# Patient Record
Sex: Female | Born: 1975 | Race: Black or African American | Hispanic: No | Marital: Single | Smoking: Never smoker
Health system: Southern US, Community
[De-identification: ages and names within clinical notes are randomized; demographics above are authoritative.]

## PROBLEM LIST (undated history)

## (undated) DIAGNOSIS — F419 Anxiety disorder, unspecified: Secondary | ICD-10-CM

## (undated) DIAGNOSIS — F32A Depression, unspecified: Secondary | ICD-10-CM

## (undated) DIAGNOSIS — T7840XA Allergy, unspecified, initial encounter: Secondary | ICD-10-CM

## (undated) HISTORY — PX: CHOLECYSTECTOMY: SHX55

## (undated) HISTORY — PX: BREAST REDUCTION SURGERY: SHX8

## (undated) HISTORY — DX: Allergy, unspecified, initial encounter: T78.40XA

## (undated) HISTORY — PX: BREAST SURGERY: SHX581

## (undated) HISTORY — PX: TONSILLECTOMY: SUR1361

## (undated) HISTORY — PX: LIPOSUCTION: SHX10

---

## 2018-04-29 DIAGNOSIS — R55 Syncope and collapse: Secondary | ICD-10-CM | POA: Insufficient documentation

## 2019-08-17 DIAGNOSIS — J3089 Other allergic rhinitis: Secondary | ICD-10-CM | POA: Diagnosis not present

## 2019-08-17 DIAGNOSIS — Z760 Encounter for issue of repeat prescription: Secondary | ICD-10-CM | POA: Diagnosis not present

## 2020-01-04 DIAGNOSIS — E282 Polycystic ovarian syndrome: Secondary | ICD-10-CM | POA: Diagnosis not present

## 2020-01-10 DIAGNOSIS — E282 Polycystic ovarian syndrome: Secondary | ICD-10-CM | POA: Diagnosis not present

## 2020-01-31 DIAGNOSIS — R5383 Other fatigue: Secondary | ICD-10-CM | POA: Diagnosis not present

## 2020-01-31 DIAGNOSIS — E282 Polycystic ovarian syndrome: Secondary | ICD-10-CM | POA: Diagnosis not present

## 2020-01-31 DIAGNOSIS — L659 Nonscarring hair loss, unspecified: Secondary | ICD-10-CM | POA: Diagnosis not present

## 2020-06-20 DIAGNOSIS — E282 Polycystic ovarian syndrome: Secondary | ICD-10-CM | POA: Diagnosis not present

## 2020-07-04 DIAGNOSIS — E282 Polycystic ovarian syndrome: Secondary | ICD-10-CM | POA: Insufficient documentation

## 2020-07-04 DIAGNOSIS — J309 Allergic rhinitis, unspecified: Secondary | ICD-10-CM | POA: Insufficient documentation

## 2020-08-21 DIAGNOSIS — M9901 Segmental and somatic dysfunction of cervical region: Secondary | ICD-10-CM | POA: Diagnosis not present

## 2020-08-21 DIAGNOSIS — M545 Low back pain: Secondary | ICD-10-CM | POA: Diagnosis not present

## 2020-08-21 DIAGNOSIS — M5032 Other cervical disc degeneration, mid-cervical region, unspecified level: Secondary | ICD-10-CM | POA: Diagnosis not present

## 2020-08-21 DIAGNOSIS — M9903 Segmental and somatic dysfunction of lumbar region: Secondary | ICD-10-CM | POA: Diagnosis not present

## 2020-08-23 DIAGNOSIS — M5032 Other cervical disc degeneration, mid-cervical region, unspecified level: Secondary | ICD-10-CM | POA: Diagnosis not present

## 2020-08-23 DIAGNOSIS — M9903 Segmental and somatic dysfunction of lumbar region: Secondary | ICD-10-CM | POA: Diagnosis not present

## 2020-08-23 DIAGNOSIS — M9901 Segmental and somatic dysfunction of cervical region: Secondary | ICD-10-CM | POA: Diagnosis not present

## 2020-08-23 DIAGNOSIS — M545 Low back pain: Secondary | ICD-10-CM | POA: Diagnosis not present

## 2020-08-27 DIAGNOSIS — M9903 Segmental and somatic dysfunction of lumbar region: Secondary | ICD-10-CM | POA: Diagnosis not present

## 2020-08-27 DIAGNOSIS — M9901 Segmental and somatic dysfunction of cervical region: Secondary | ICD-10-CM | POA: Diagnosis not present

## 2020-08-27 DIAGNOSIS — M545 Low back pain: Secondary | ICD-10-CM | POA: Diagnosis not present

## 2020-08-27 DIAGNOSIS — M5032 Other cervical disc degeneration, mid-cervical region, unspecified level: Secondary | ICD-10-CM | POA: Diagnosis not present

## 2020-08-29 DIAGNOSIS — M9903 Segmental and somatic dysfunction of lumbar region: Secondary | ICD-10-CM | POA: Diagnosis not present

## 2020-08-29 DIAGNOSIS — M5032 Other cervical disc degeneration, mid-cervical region, unspecified level: Secondary | ICD-10-CM | POA: Diagnosis not present

## 2020-08-29 DIAGNOSIS — M9901 Segmental and somatic dysfunction of cervical region: Secondary | ICD-10-CM | POA: Diagnosis not present

## 2020-08-29 DIAGNOSIS — M62830 Muscle spasm of back: Secondary | ICD-10-CM | POA: Diagnosis not present

## 2020-09-03 DIAGNOSIS — M9901 Segmental and somatic dysfunction of cervical region: Secondary | ICD-10-CM | POA: Diagnosis not present

## 2020-09-03 DIAGNOSIS — M9903 Segmental and somatic dysfunction of lumbar region: Secondary | ICD-10-CM | POA: Diagnosis not present

## 2020-09-03 DIAGNOSIS — M62830 Muscle spasm of back: Secondary | ICD-10-CM | POA: Diagnosis not present

## 2020-09-03 DIAGNOSIS — M5032 Other cervical disc degeneration, mid-cervical region, unspecified level: Secondary | ICD-10-CM | POA: Diagnosis not present

## 2020-09-05 DIAGNOSIS — M5032 Other cervical disc degeneration, mid-cervical region, unspecified level: Secondary | ICD-10-CM | POA: Diagnosis not present

## 2020-09-05 DIAGNOSIS — M62830 Muscle spasm of back: Secondary | ICD-10-CM | POA: Diagnosis not present

## 2020-09-05 DIAGNOSIS — M9901 Segmental and somatic dysfunction of cervical region: Secondary | ICD-10-CM | POA: Diagnosis not present

## 2020-09-05 DIAGNOSIS — M9903 Segmental and somatic dysfunction of lumbar region: Secondary | ICD-10-CM | POA: Diagnosis not present

## 2020-09-10 DIAGNOSIS — M9903 Segmental and somatic dysfunction of lumbar region: Secondary | ICD-10-CM | POA: Diagnosis not present

## 2020-09-10 DIAGNOSIS — M62830 Muscle spasm of back: Secondary | ICD-10-CM | POA: Diagnosis not present

## 2020-09-10 DIAGNOSIS — M9901 Segmental and somatic dysfunction of cervical region: Secondary | ICD-10-CM | POA: Diagnosis not present

## 2020-09-10 DIAGNOSIS — M5032 Other cervical disc degeneration, mid-cervical region, unspecified level: Secondary | ICD-10-CM | POA: Diagnosis not present

## 2020-09-12 DIAGNOSIS — M9901 Segmental and somatic dysfunction of cervical region: Secondary | ICD-10-CM | POA: Diagnosis not present

## 2020-09-12 DIAGNOSIS — M5032 Other cervical disc degeneration, mid-cervical region, unspecified level: Secondary | ICD-10-CM | POA: Diagnosis not present

## 2020-09-12 DIAGNOSIS — M9903 Segmental and somatic dysfunction of lumbar region: Secondary | ICD-10-CM | POA: Diagnosis not present

## 2020-09-12 DIAGNOSIS — M62830 Muscle spasm of back: Secondary | ICD-10-CM | POA: Diagnosis not present

## 2020-09-17 DIAGNOSIS — M9903 Segmental and somatic dysfunction of lumbar region: Secondary | ICD-10-CM | POA: Diagnosis not present

## 2020-09-17 DIAGNOSIS — M9901 Segmental and somatic dysfunction of cervical region: Secondary | ICD-10-CM | POA: Diagnosis not present

## 2020-09-17 DIAGNOSIS — M5032 Other cervical disc degeneration, mid-cervical region, unspecified level: Secondary | ICD-10-CM | POA: Diagnosis not present

## 2020-09-17 DIAGNOSIS — M62830 Muscle spasm of back: Secondary | ICD-10-CM | POA: Diagnosis not present

## 2020-09-19 DIAGNOSIS — M62830 Muscle spasm of back: Secondary | ICD-10-CM | POA: Diagnosis not present

## 2020-09-19 DIAGNOSIS — M5032 Other cervical disc degeneration, mid-cervical region, unspecified level: Secondary | ICD-10-CM | POA: Diagnosis not present

## 2020-09-19 DIAGNOSIS — M9903 Segmental and somatic dysfunction of lumbar region: Secondary | ICD-10-CM | POA: Diagnosis not present

## 2020-09-19 DIAGNOSIS — M9901 Segmental and somatic dysfunction of cervical region: Secondary | ICD-10-CM | POA: Diagnosis not present

## 2020-09-24 DIAGNOSIS — M9901 Segmental and somatic dysfunction of cervical region: Secondary | ICD-10-CM | POA: Diagnosis not present

## 2020-09-24 DIAGNOSIS — M5032 Other cervical disc degeneration, mid-cervical region, unspecified level: Secondary | ICD-10-CM | POA: Diagnosis not present

## 2020-09-24 DIAGNOSIS — M9903 Segmental and somatic dysfunction of lumbar region: Secondary | ICD-10-CM | POA: Diagnosis not present

## 2020-09-24 DIAGNOSIS — M62830 Muscle spasm of back: Secondary | ICD-10-CM | POA: Diagnosis not present

## 2020-09-26 DIAGNOSIS — M5032 Other cervical disc degeneration, mid-cervical region, unspecified level: Secondary | ICD-10-CM | POA: Diagnosis not present

## 2020-09-26 DIAGNOSIS — M9901 Segmental and somatic dysfunction of cervical region: Secondary | ICD-10-CM | POA: Diagnosis not present

## 2020-09-26 DIAGNOSIS — M9903 Segmental and somatic dysfunction of lumbar region: Secondary | ICD-10-CM | POA: Diagnosis not present

## 2020-09-26 DIAGNOSIS — M62830 Muscle spasm of back: Secondary | ICD-10-CM | POA: Diagnosis not present

## 2020-10-28 DIAGNOSIS — E669 Obesity, unspecified: Secondary | ICD-10-CM | POA: Diagnosis not present

## 2020-10-28 DIAGNOSIS — E282 Polycystic ovarian syndrome: Secondary | ICD-10-CM | POA: Diagnosis not present

## 2020-10-28 DIAGNOSIS — R7303 Prediabetes: Secondary | ICD-10-CM | POA: Diagnosis not present

## 2020-10-28 DIAGNOSIS — Z79899 Other long term (current) drug therapy: Secondary | ICD-10-CM | POA: Diagnosis not present

## 2020-10-28 DIAGNOSIS — Z8639 Personal history of other endocrine, nutritional and metabolic disease: Secondary | ICD-10-CM | POA: Diagnosis not present

## 2020-10-28 DIAGNOSIS — E559 Vitamin D deficiency, unspecified: Secondary | ICD-10-CM | POA: Diagnosis not present

## 2020-10-28 DIAGNOSIS — Z6830 Body mass index (BMI) 30.0-30.9, adult: Secondary | ICD-10-CM | POA: Diagnosis not present

## 2020-11-06 DIAGNOSIS — M47816 Spondylosis without myelopathy or radiculopathy, lumbar region: Secondary | ICD-10-CM | POA: Diagnosis not present

## 2020-11-07 DIAGNOSIS — G894 Chronic pain syndrome: Secondary | ICD-10-CM | POA: Diagnosis not present

## 2020-11-07 DIAGNOSIS — M47816 Spondylosis without myelopathy or radiculopathy, lumbar region: Secondary | ICD-10-CM | POA: Diagnosis not present

## 2021-02-11 DIAGNOSIS — M47816 Spondylosis without myelopathy or radiculopathy, lumbar region: Secondary | ICD-10-CM | POA: Diagnosis not present

## 2021-02-18 DIAGNOSIS — M47816 Spondylosis without myelopathy or radiculopathy, lumbar region: Secondary | ICD-10-CM | POA: Diagnosis not present

## 2021-03-27 DIAGNOSIS — G894 Chronic pain syndrome: Secondary | ICD-10-CM | POA: Diagnosis not present

## 2021-03-27 DIAGNOSIS — M47816 Spondylosis without myelopathy or radiculopathy, lumbar region: Secondary | ICD-10-CM | POA: Diagnosis not present

## 2021-03-27 DIAGNOSIS — M5136 Other intervertebral disc degeneration, lumbar region: Secondary | ICD-10-CM | POA: Diagnosis not present

## 2021-03-28 ENCOUNTER — Other Ambulatory Visit: Payer: Self-pay | Admitting: *Deleted

## 2021-03-28 DIAGNOSIS — Z1231 Encounter for screening mammogram for malignant neoplasm of breast: Secondary | ICD-10-CM

## 2021-06-08 ENCOUNTER — Encounter (HOSPITAL_BASED_OUTPATIENT_CLINIC_OR_DEPARTMENT_OTHER): Payer: Self-pay | Admitting: Obstetrics and Gynecology

## 2021-06-08 ENCOUNTER — Emergency Department (HOSPITAL_BASED_OUTPATIENT_CLINIC_OR_DEPARTMENT_OTHER): Payer: BC Managed Care – PPO

## 2021-06-08 ENCOUNTER — Emergency Department (HOSPITAL_BASED_OUTPATIENT_CLINIC_OR_DEPARTMENT_OTHER)
Admission: EM | Admit: 2021-06-08 | Discharge: 2021-06-08 | Disposition: A | Discharge status: Home / Self Care | Payer: BC Managed Care – PPO | Attending: Emergency Medicine | Admitting: Emergency Medicine

## 2021-06-08 ENCOUNTER — Other Ambulatory Visit: Payer: Self-pay

## 2021-06-08 DIAGNOSIS — R0602 Shortness of breath: Secondary | ICD-10-CM | POA: Diagnosis not present

## 2021-06-08 DIAGNOSIS — R0789 Other chest pain: Secondary | ICD-10-CM | POA: Insufficient documentation

## 2021-06-08 DIAGNOSIS — E161 Other hypoglycemia: Secondary | ICD-10-CM | POA: Diagnosis not present

## 2021-06-08 DIAGNOSIS — F410 Panic disorder [episodic paroxysmal anxiety] without agoraphobia: Secondary | ICD-10-CM | POA: Diagnosis not present

## 2021-06-08 DIAGNOSIS — E162 Hypoglycemia, unspecified: Secondary | ICD-10-CM | POA: Diagnosis not present

## 2021-06-08 DIAGNOSIS — R079 Chest pain, unspecified: Secondary | ICD-10-CM | POA: Diagnosis not present

## 2021-06-08 HISTORY — DX: Anxiety disorder, unspecified: F41.9

## 2021-06-08 HISTORY — DX: Depression, unspecified: F32.A

## 2021-06-08 LAB — CBC WITH DIFFERENTIAL/PLATELET
Abs Immature Granulocytes: 0.01 10*3/uL (ref 0.00–0.07)
Basophils Absolute: 0.1 10*3/uL (ref 0.0–0.1)
Basophils Relative: 1 %
Eosinophils Absolute: 0.2 10*3/uL (ref 0.0–0.5)
Eosinophils Relative: 4 %
HCT: 43.6 % (ref 36.0–46.0)
Hemoglobin: 14.4 g/dL (ref 12.0–15.0)
Immature Granulocytes: 0 %
Lymphocytes Relative: 21 %
Lymphs Abs: 1.2 10*3/uL (ref 0.7–4.0)
MCH: 30.4 pg (ref 26.0–34.0)
MCHC: 33 g/dL (ref 30.0–36.0)
MCV: 92 fL (ref 80.0–100.0)
Monocytes Absolute: 0.2 10*3/uL (ref 0.1–1.0)
Monocytes Relative: 4 %
Neutro Abs: 3.8 10*3/uL (ref 1.7–7.7)
Neutrophils Relative %: 70 %
Platelets: 283 10*3/uL (ref 150–400)
RBC: 4.74 MIL/uL (ref 3.87–5.11)
RDW: 13.7 % (ref 11.5–15.5)
WBC: 5.5 10*3/uL (ref 4.0–10.5)
nRBC: 0 % (ref 0.0–0.2)

## 2021-06-08 LAB — BASIC METABOLIC PANEL
Anion gap: 13 (ref 5–15)
BUN: 10 mg/dL (ref 6–20)
CO2: 23 mmol/L (ref 22–32)
Calcium: 9 mg/dL (ref 8.9–10.3)
Chloride: 105 mmol/L (ref 98–111)
Creatinine, Ser: 0.67 mg/dL (ref 0.44–1.00)
GFR, Estimated: >60 mL/min (ref >60)
Glucose, Bld: 81 mg/dL (ref 70–99)
Potassium: 4 mmol/L (ref 3.5–5.1)
Sodium: 141 mmol/L (ref 135–145)

## 2021-06-08 LAB — TROPONIN I (HIGH SENSITIVITY): Troponin I (High Sensitivity): <2 ng/L (ref <18)

## 2021-06-08 MED ORDER — ONDANSETRON 4 MG PO TBDP
4.0000 mg | ORAL_TABLET | Freq: Once | ORAL | Status: AC
  Administered 2021-06-08: 4 mg via ORAL
  Filled 2021-06-08: qty 1

## 2021-06-08 MED ORDER — SODIUM CHLORIDE 0.9 % IV BOLUS
1000.0000 mL | Freq: Once | INTRAVENOUS | Status: AC
  Administered 2021-06-08: 1000 mL via INTRAVENOUS

## 2021-06-08 MED ORDER — KETOROLAC TROMETHAMINE 15 MG/ML IJ SOLN
15.0000 mg | Freq: Once | INTRAMUSCULAR | Status: AC
  Administered 2021-06-08: 15 mg via INTRAMUSCULAR
  Filled 2021-06-08: qty 1

## 2021-06-08 MED ORDER — DIAZEPAM 5 MG PO TABS
5.0000 mg | ORAL_TABLET | Freq: Once | ORAL | Status: AC
  Administered 2021-06-08: 5 mg via ORAL
  Filled 2021-06-08: qty 1

## 2021-06-08 NOTE — ED Triage Notes (Signed)
Patient reports to the ER via EMS for a panic attack that she reports she was having cold sweats, anxious, shaking and pure panic. Patient states she used to be on medication for anxiety but weaned herself off the medication over 6 months. Patient denies suicidal/homicidal thoughts. Patient reports nausea, emesis, and chest pain that she related to the anxiety. Patient reports she lost her grandmother on Thursday and that has been the catalyst to this. Patient reports her support system consists of mostly her daughter, who she is trying to be a support for during this difficult time.

## 2021-06-08 NOTE — ED Notes (Signed)
Pt dc home ambulatory. Pt understands dc instructions and verbalized understanding.

## 2021-06-08 NOTE — Discharge Instructions (Addendum)
Take 4 over the counter ibuprofen tablets 3 times a day or 2 over-the-counter naproxen tablets twice a day for pain. Also take tylenol 1000mg(2 extra strength) four times a day.    

## 2021-06-08 NOTE — ED Provider Notes (Signed)
[facility] EMERGENCY DEPT Provider Note   CSN: [id number] Arrival date & time: 06/08/21  1157     History Chief Complaint  Patient presents with   Panic Attack    Kaitlyn Guzman is a 45 y.o. female.  45 yo F with a chief complaints of increased anxiety and a feeling of dread and hopelessness.  Patient had a death of a family member recently and has been having episodes that make her have chest pain and difficulty breathing.  Worsening over the past 3 or 4 days typically if she tries to control her breathing she can make these resolved but now feels like she is having continued trembling of her hands and shaking of her voice and chest discomfort.  She denies cough congestion or fever denies abdominal pain denies nausea vomiting or diarrhea.  She used to be on antidepressants but stopped.  Recently moved to the area from [place].  No symptoms when she is active but when she stops and has time to think about what is going on her life she has recurrence.  The history is provided by the patient.  Illness Severity:  Moderate Onset quality:  Gradual Duration:  2 weeks Timing:  Intermittent Progression:  Waxing and waning Chronicity:  Recurrent Associated symptoms: chest pain and shortness of breath   Associated symptoms: no congestion, no fever, no headaches, no myalgias, no nausea, no rhinorrhea, no vomiting and no wheezing       Past Medical History:  Diagnosis Date   Anxiety    Depression     There are no problems to display for this patient.   Past Surgical History:  Procedure Laterality Date   BREAST REDUCTION SURGERY Bilateral    CHOLECYSTECTOMY     TONSILLECTOMY       OB History     Gravida      Para      Term      Preterm      AB      Living  1      SAB      IAB      Ectopic      Multiple      Live Births              No family history on file.  Social History   Tobacco Use   Smoking status: Never    Passive  exposure: Past   Smokeless tobacco: Never  Vaping Use   Vaping Use: Never used  Substance Use Topics   Alcohol use: Yes    Alcohol/week: 7.0 standard drinks    Types: 7 Glasses of wine per week   Drug use: Never    Home Medications Prior to Admission medications   Not on File    Allergies    Sulfa antibiotics  Review of Systems   Review of Systems  Constitutional:  Negative for chills and fever.  HENT:  Negative for congestion and rhinorrhea.   Eyes:  Negative for redness and visual disturbance.  Respiratory:  Positive for shortness of breath. Negative for wheezing.   Cardiovascular:  Positive for chest pain. Negative for palpitations.  Gastrointestinal:  Negative for nausea and vomiting.  Genitourinary:  Negative for dysuria and urgency.  Musculoskeletal:  Negative for arthralgias and myalgias.  Skin:  Negative for pallor and wound.  Neurological:  Negative for dizziness and headaches.   Physical Exam Updated Vital Signs BP 136/86 (BP Location: Right Arm)   Pulse 91   Temp 98.9  F (37.2 C)   Resp 16   Ht 5\' 2"  (1.575 m)   Wt 87.1 kg   SpO2 100%   BMI 35.12 kg/m   Physical Exam Vitals and nursing note reviewed.  Constitutional:      General: She is not in acute distress.    Appearance: She is well-developed. She is not diaphoretic.  HENT:     Head: Normocephalic and atraumatic.  Eyes:     Pupils: Pupils are equal, round, and reactive to light.  Cardiovascular:     Rate and Rhythm: Normal rate and regular rhythm.     Heart sounds: No murmur heard.   No friction rub. No gallop.  Pulmonary:     Effort: Pulmonary effort is normal.     Breath sounds: No wheezing or rales.     Comments: Pain with palpation along the sternum reproduces the patient's pain. Chest:     Chest wall: Tenderness present.  Abdominal:     General: There is no distension.     Palpations: Abdomen is soft.     Tenderness: There is no abdominal tenderness.  Musculoskeletal:         General: No tenderness.     Cervical back: Normal range of motion and neck supple.  Skin:    General: Skin is warm and dry.  Neurological:     Mental Status: She is alert and oriented to person, place, and time.  Psychiatric:        Behavior: Behavior normal.    ED Results / Procedures / Treatments   Labs (all labs ordered are listed, but only abnormal results are displayed) Labs Reviewed  BASIC METABOLIC PANEL  CBC WITH DIFFERENTIAL/PLATELET  CBC WITH DIFFERENTIAL/PLATELET  TROPONIN I (HIGH SENSITIVITY)    EKG EKG Interpretation  Date/Time:  Sunday June 08 2021 12:02:56 EDT Ventricular Rate:  76 PR Interval:  137 QRS Duration: 92 QT Interval:  378 QTC Calculation: 425 R Axis:   10 Text Interpretation: Sinus rhythm Nonspecific T abnormalities, anterior leads isolated flipped t waves in III, v3 No old tracing to compare Confirmed by [id number] [phone]) on 06/08/2021 12:07:41 PM  Radiology DG Chest Port 1 View  Result Date: 06/08/2021 CLINICAL DATA:  Chest pain.  Anxiety attack. EXAM: PORTABLE CHEST 1 VIEW COMPARISON:  None. FINDINGS: The heart size and mediastinal contours are within normal limits. Both lungs are clear. The visualized skeletal structures are unremarkable. IMPRESSION: No active disease. Electronically Signed   By: 08/09/2021 M.D.   On: 06/08/2021 13:06    Procedures Procedures  Procedure note: Ultrasound Guided Peripheral IV Ultrasound guided peripheral 1.88 inch angiocath IV placement performed by me. Indications: Nursing unable to place IV. Details: The antecubital fossa and upper arm were evaluated with a multifrequency linear probe. Patent brachial veins were noted. 1 attempt was made to cannulate a vein under realtime 08/09/2021 guidance with successful cannulation of the vein and catheter placement. There is return of non-pulsatile dark red blood. The patient tolerated the procedure well without complications. Images archived electronically.  CPT codes: [place]  and [phone]  Medications Ordered in ED Medications  ketorolac (TORADOL) 15 MG/ML injection 15 mg (15 mg Intramuscular Given 06/08/21 1341)  diazepam (VALIUM) tablet 5 mg (5 mg Oral Given 06/08/21 1348)  ondansetron (ZOFRAN-ODT) disintegrating tablet 4 mg (4 mg Oral Given 06/08/21 1340)  sodium chloride 0.9 % bolus 1,000 mL (1,000 mLs Intravenous New Bag/Given 06/08/21 1456)    ED Course  I have reviewed the triage  vital signs and the nursing notes.  Pertinent labs & imaging results that were available during my care of the patient were reviewed by me and considered in my medical decision making (see chart for details).    MDM Rules/Calculators/A&P                          45 yo F with a chief complaints of chest pain shortness of breath and a feeling of anxiety.  Going on for couple weeks now but worsening over the past few days.  Atypical from ACS presentation we will obtain EKG chest x-ray blood work.  Difficulty obtaining blood.  ? Dehydration, give bolus.  Awaiting cbc, bmp.  CBC without anemia, no leukocytosis, metabolic panel without concerning electrolyte abnormality.   3:06 PM:  I have discussed the diagnosis/risks/treatment options with the patient and believe the pt to be eligible for discharge home to follow-up with PCP. We also discussed returning to the ED immediately if new or worsening sx occur. We discussed the sx which are most concerning (e.g., sudden worsening pain, fever, inability to tolerate by mouth) that necessitate immediate return. Medications administered to the patient during their visit and any new prescriptions provided to the patient are listed below.  Medications given during this visit Medications  ketorolac (TORADOL) 15 MG/ML injection 15 mg (15 mg Intramuscular Given 06/08/21 1341)  diazepam (VALIUM) tablet 5 mg (5 mg Oral Given 06/08/21 1348)  ondansetron (ZOFRAN-ODT) disintegrating tablet 4 mg (4 mg Oral Given 06/08/21 1340)  sodium chloride 0.9 % bolus  1,000 mL (1,000 mLs Intravenous New Bag/Given 06/08/21 1456)     The patient appears reasonably screen and/or stabilized for discharge and I doubt any other medical condition or other [facility] requiring further screening, evaluation, or treatment in the ED at this time prior to discharge.    Final Clinical Impression(s) / ED Diagnoses Final diagnoses:  Chest wall pain    Rx / DC Orders ED Discharge Orders     None        Melene Plan, DO 06/08/21 1506

## 2021-06-11 ENCOUNTER — Other Ambulatory Visit: Payer: Self-pay

## 2021-06-11 ENCOUNTER — Ambulatory Visit (HOSPITAL_BASED_OUTPATIENT_CLINIC_OR_DEPARTMENT_OTHER): Payer: BC Managed Care – PPO | Admitting: Family Medicine

## 2021-06-11 ENCOUNTER — Encounter (HOSPITAL_BASED_OUTPATIENT_CLINIC_OR_DEPARTMENT_OTHER): Payer: Self-pay | Admitting: Family Medicine

## 2021-06-11 VITALS — BP 136/96 | HR 92 | Ht 62.0 in | Wt 192.0 lb

## 2021-06-11 DIAGNOSIS — R030 Elevated blood-pressure reading, without diagnosis of hypertension: Secondary | ICD-10-CM | POA: Diagnosis not present

## 2021-06-11 DIAGNOSIS — F419 Anxiety disorder, unspecified: Secondary | ICD-10-CM

## 2021-06-11 MED ORDER — CLONAZEPAM 0.5 MG PO TABS: 0.5000 mg | ORAL_TABLET | Freq: Three times a day (TID) | ORAL | 0 refills | Status: DC | PRN

## 2021-06-11 MED ORDER — CITALOPRAM HYDROBROMIDE 10 MG PO TABS: 10.0000 mg | ORAL_TABLET | Freq: Every day | ORAL | 0 refills | Status: DC

## 2021-06-11 NOTE — Assessment & Plan Note (Addendum)
Patient with acute exacerbation, known history of anxiety for which she has been prescribed Celexa, BuSpar, clonazepam in the past Symptoms currently not controlled, discussed options with patient Will restart citalopram at 10 mg daily Prescription provided for clonazepam 0.5 mg to be used up to 3 times daily as needed.  Discussed that eventual goal is gradual weaning of clonazepam as use of SSRI should lead to gradual provement in symptoms and less reliance on benzodiazepine Will also refer to Dr. Bosie Clos for CBT Consider referral to psychiatry pending progress with above Will follow-up in 2 weeks to monitor response to treatment, monitor for side effects Could consider increasing to 20 mg dose at next visit depending on response Did discuss that benefits from SSRI can take up to 4 to 6 weeks to appreciate No suicidal or homicidal ideation at this time.  Did discuss that if either of these develop to contact our clinic or to present to the emergency room for evaluation and management PDMP Reviewed today - no prescriptions filled on file over the past 2 years

## 2021-06-11 NOTE — Assessment & Plan Note (Signed)
Likely related to acutely anxious state Will continue to monitor at future office visits Encourage adherence to DASH diet, gradual increase in physical activity

## 2021-06-11 NOTE — Patient Instructions (Addendum)
  Medication Instructions:  Your physician has recommended you make the following change in your medication:  -- Start Celexa 10 mg - Take 1 tablet by mouth daily -- Start Clonezpam 0.5 mg - Take 1 tablet by mouth three times daily as needed --If you need a refill on any your medications before your next appointment, please call your pharmacy first. If no refills are authorized on file call the office.--  Referral: A referral has been placed for you to Dr. Bosie Clos with [facility]. Dr. Bosie Clos is a psychologist who specializes in cognitive and behavioral therapy, he does not prescribe medications. Someone from the scheduling department will be in contact with you in regards to coordinating your consultation. If you do not hear from any of the schedulers within 7-10 business days please give our office a call  Follow-Up: Your next appointment:   Your physician recommends that you schedule a follow-up appointment in: 2 WEEKS with Dr. de [place]  Thanks for letting us be apart of your health journey!!  Primary Care and Sports Medicine   Dr. Ceasar Mons [place]   We encourage you to activate your patient portal called "MyChart".  Sign up information is provided on this After Visit Summary.  MyChart is used to connect with patients for Virtual Visits (Telemedicine).  Patients are able to view lab/test results, encounter notes, upcoming appointments, etc.  Non-urgent messages can be sent to your provider as well. To learn more about what you can do with MyChart, please visit --  [email or web address].

## 2021-06-11 NOTE — Progress Notes (Addendum)
New Patient Office Visit  Subjective:  Patient ID: Kaitlyn Guzman, female    DOB: January 11, 1976  Age: 45 y.o. MRN: [id number]  CC:  Chief Complaint  Patient presents with   Establish Care    Pt just relocated to [facility] last year   Anxiety    Pt seen in ED for an anxiety attack. Patient has longstanding hx of anxiety dating back to 2014. In 2014 pt was prescribed Buspirone, Celexa, and Klonopin which she states kept her in a zombie state. She didn't get pout of the bed for 6 months. She weaned herself off all meds but May/June of 2015. Patient has been managing since with breathing exercises, OTC anxiety and stress meds but after the sudden loss of her grandmother last Thursday she feels things are out of control again.    Medication Reaction    Patient has been prescribed Klonopin, Buspirone, and Celexa in the past which she didn't tolerate well. She states she was given valium in the ED and it helped but once it wore off she was still very anxious, having chest pains, SOB, etc so she had to take an expired Klonopin. She is not currently prescribed any treatment    HPI Kaitlyn Guzman is a 45 year old female presenting to establish in clinic.  She has current concerns related to anxiety.  Patient with known history of anxiety/depression.  Has had recent worsening due to life circumstances.  In the past she has been on GLP-1 agonist for treatment of obesity.  Anxiety: History as above, did present to emergency department recently due to worsening symptoms due to the passing of her grandmother.  In the past she has been on citalopram, buspirone, clonazepam.  She had weaned off of all of these medications and was primarily managing with conservative measures, alternative modalities, breathing techniques.  Unfortunately, with the passing of her grandmother she has had notable worsening in symptoms related to grieving.  Patient moved to the area about 1 year ago, previously was living in  [place].  Past Medical History:  Diagnosis Date   Anxiety    Depression     Past Surgical History:  Procedure Laterality Date   BREAST REDUCTION SURGERY Bilateral    CHOLECYSTECTOMY     TONSILLECTOMY      Family History  Problem Relation Age of Onset   Hypertension Mother    Diabetes Mother    Diabetes Father    Hypertension Father    Cancer Father    Diabetes Maternal Grandmother    Hypertension Maternal Grandmother    Heart disease Maternal Grandmother    Gout Maternal Grandfather    Diabetes Maternal Grandfather    Hypertension Maternal Grandfather     Social History   Socioeconomic History   Marital status: Single    Spouse name: Not on file   Number of children: Not on file   Years of education: Not on file   Highest education level: Not on file  Occupational History   Not on file  Tobacco Use   Smoking status: Never    Passive exposure: Past   Smokeless tobacco: Never  Vaping Use   Vaping Use: Never used  Substance and Sexual Activity   Alcohol use: Yes    Alcohol/week: 7.0 standard drinks    Types: 7 Glasses of wine per week   Drug use: Never   Sexual activity: Yes  Other Topics Concern   Not on file  Social History Narrative   Not on file  Social Determinants of Health   Financial Resource Strain: Not on file  Food Insecurity: Not on file  Transportation Needs: Not on file  Physical Activity: Not on file  Stress: Not on file  Social Connections: Not on file  Intimate Partner Violence: Not on file    Objective:   Today's Vitals: BP (!) 136/96   Pulse 92   Ht 5\' 2"  (1.575 m)   Wt 192 lb (87.1 kg)   SpO2 98%   BMI 35.12 kg/m   Physical Exam  45 year old female in no acute distress Cardiovascular exam with regular rate and rhythm, no murmurs appreciated Lungs clear to auscultation bilaterally  Assessment & Plan:   Problem List Items Addressed This Visit       Other   Anxiety - Primary    Patient with acute exacerbation,  known history of anxiety for which she has been prescribed Celexa, BuSpar, clonazepam in the past Symptoms currently not controlled, discussed options with patient Will restart citalopram at 10 mg daily Prescription provided for clonazepam 0.5 mg to be used up to 3 times daily as needed.  Discussed that eventual goal is gradual weaning of clonazepam as use of SSRI should lead to gradual provement in symptoms and less reliance on benzodiazepine Will also refer to Dr. 55 for CBT Consider referral to psychiatry pending progress with above Will follow-up in 2 weeks to monitor response to treatment, monitor for side effects Could consider increasing to 20 mg dose at next visit depending on response Did discuss that benefits from SSRI can take up to 4 to 6 weeks to appreciate No suicidal or homicidal ideation at this time.  Did discuss that if either of these develop to contact our clinic or to present to the emergency room for evaluation and management PDMP Reviewed today - no prescriptions filled on file over the past 2 years       Relevant Medications   citalopram (CELEXA) 10 MG tablet   Other Relevant Orders   Ambulatory referral to Psychology   Elevated blood pressure reading in office without diagnosis of hypertension    Likely related to acutely anxious state Will continue to monitor at future office visits Encourage adherence to DASH diet, gradual increase in physical activity        Outpatient Encounter Medications as of 06/11/2021  Medication Sig   citalopram (CELEXA) 10 MG tablet Take 1 tablet (10 mg total) by mouth daily.   clonazePAM (KLONOPIN) 0.5 MG tablet Take 1 tablet (0.5 mg total) by mouth 3 (three) times daily as needed for anxiety.   No facility-administered encounter medications on file as of 06/11/2021.   Spent 45 minutes on this patient encounter, including preparation, chart review, face-to-face counseling with patient and coordination of care, and  documentation of encounter  Follow-up: No follow-ups on file.  Plan for follow-up in 2 weeks to monitor response to above  Toran Murch J De 06/13/2021, MD

## 2021-06-25 ENCOUNTER — Encounter (HOSPITAL_BASED_OUTPATIENT_CLINIC_OR_DEPARTMENT_OTHER): Payer: Self-pay | Admitting: Family Medicine

## 2021-06-25 ENCOUNTER — Ambulatory Visit (HOSPITAL_BASED_OUTPATIENT_CLINIC_OR_DEPARTMENT_OTHER): Payer: BC Managed Care – PPO | Admitting: Family Medicine

## 2021-06-25 ENCOUNTER — Other Ambulatory Visit: Payer: Self-pay

## 2021-06-25 VITALS — BP 124/86 | HR 73 | Ht 62.0 in | Wt 188.0 lb

## 2021-06-25 DIAGNOSIS — F419 Anxiety disorder, unspecified: Secondary | ICD-10-CM | POA: Diagnosis not present

## 2021-06-25 MED ORDER — CITALOPRAM HYDROBROMIDE 20 MG PO TABS: 20.0000 mg | ORAL_TABLET | Freq: Every day | ORAL | 1 refills | Status: DC

## 2021-06-25 NOTE — Assessment & Plan Note (Signed)
Feels that she has had some improvement since last visit with me, still noting feelings of anxiousness Reports that she has been taking escitalopram at night as she feels that it makes her drowsy Has been using clonazepam about 2-3 times daily as needed Does also have chest pain intermittently, feels sore to the touch along sternum On exam, normal heart rate, regular rhythm, no murmurs appreciated.  Does have some tenderness along sternal borders bilaterally Will increase dose of citalopram to 20 mg once a day and monitor for side effects, reassess benefit at follow-up visit in about 3 to 4 weeks Can continue with clonazepam as needed, discussed risks associated with regular, long-term use and that eventual goal is gradual weaning of benzodiazepine. Precautions again discussed

## 2021-06-25 NOTE — Patient Instructions (Signed)
  Medication Instructions:  Your physician has recommended you make the following change in your medication:  -- INCREASE Citalopram to 20 mg - Take 1 tablet by mouth daily - NEW RX SENT --If you need a refill on any your medications before your next appointment, please call your pharmacy first. If no refills are authorized on file call the office.-- Follow-Up: Your next appointment:   Your physician recommends that you schedule a follow-up appointment in: 4 WEEKS with Dr. de [place]  Thanks for letting us be apart of your health journey!!  Primary Care and Sports Medicine   Dr. Ceasar Mons [place]   We encourage you to activate your patient portal called "MyChart".  Sign up information is provided on this After Visit Summary.  MyChart is used to connect with patients for Virtual Visits (Telemedicine).  Patients are able to view lab/test results, encounter notes, upcoming appointments, etc.  Non-urgent messages can be sent to your provider as well. To learn more about what you can do with MyChart, please visit --  [email or web address].

## 2021-06-25 NOTE — Progress Notes (Signed)
**Note Guzman-Identified via Obfuscation**     Procedures performed today:    None.  Independent interpretation of notes and tests performed by another provider:   None.  Brief History, Exam, Impression, and Recommendations:    BP 124/86   Pulse 73   Ht 5\' 2"  (1.575 m)   Wt 188 lb (85.3 kg)   LMP  (LMP Unknown)   SpO2 98%   BMI 34.39 kg/m   Anxiety Feels that she has had some improvement since last visit with me, still noting feelings of anxiousness Reports that she has been taking escitalopram at night as she feels that it makes her drowsy Has been using clonazepam about 2-3 times daily as needed Does also have chest pain intermittently, feels sore to the touch along sternum On exam, normal heart rate, regular rhythm, no murmurs appreciated.  Does have some tenderness along sternal borders bilaterally Will increase dose of citalopram to 20 mg once a day and monitor for side effects, reassess benefit at follow-up visit in about 3 to 4 weeks Can continue with clonazepam as needed, discussed risks associated with regular, long-term use and that eventual goal is gradual weaning of benzodiazepine. Precautions again discussed  Plan for follow-up in about 3 to 4 weeks or sooner as needed.  Complete PHQ-9 and GAD-7 at next visit   ___________________________________________ Kaitlyn Guzman , MD, ABFM, [facility] Primary Care and Sports Medicine [facility]

## 2021-06-27 ENCOUNTER — Ambulatory Visit (INDEPENDENT_AMBULATORY_CARE_PROVIDER_SITE_OTHER): Payer: BC Managed Care – PPO | Admitting: Psychologist

## 2021-06-27 DIAGNOSIS — F4322 Adjustment disorder with anxiety: Secondary | ICD-10-CM

## 2021-07-02 ENCOUNTER — Other Ambulatory Visit (HOSPITAL_BASED_OUTPATIENT_CLINIC_OR_DEPARTMENT_OTHER): Payer: Self-pay | Admitting: Family Medicine

## 2021-07-02 NOTE — Telephone Encounter (Signed)
Patient is scheduled for follow up on 08/24

## 2021-07-04 ENCOUNTER — Ambulatory Visit (INDEPENDENT_AMBULATORY_CARE_PROVIDER_SITE_OTHER): Payer: BC Managed Care – PPO | Admitting: Psychologist

## 2021-07-04 DIAGNOSIS — F4322 Adjustment disorder with anxiety: Secondary | ICD-10-CM | POA: Diagnosis not present

## 2021-07-08 DIAGNOSIS — M5432 Sciatica, left side: Secondary | ICD-10-CM | POA: Diagnosis not present

## 2021-07-08 DIAGNOSIS — M5386 Other specified dorsopathies, lumbar region: Secondary | ICD-10-CM | POA: Diagnosis not present

## 2021-07-08 DIAGNOSIS — R519 Headache, unspecified: Secondary | ICD-10-CM | POA: Diagnosis not present

## 2021-07-08 DIAGNOSIS — M9901 Segmental and somatic dysfunction of cervical region: Secondary | ICD-10-CM | POA: Diagnosis not present

## 2021-07-09 DIAGNOSIS — M5432 Sciatica, left side: Secondary | ICD-10-CM | POA: Diagnosis not present

## 2021-07-09 DIAGNOSIS — R519 Headache, unspecified: Secondary | ICD-10-CM | POA: Diagnosis not present

## 2021-07-09 DIAGNOSIS — M5386 Other specified dorsopathies, lumbar region: Secondary | ICD-10-CM | POA: Diagnosis not present

## 2021-07-09 DIAGNOSIS — M9901 Segmental and somatic dysfunction of cervical region: Secondary | ICD-10-CM | POA: Diagnosis not present

## 2021-07-14 DIAGNOSIS — M9901 Segmental and somatic dysfunction of cervical region: Secondary | ICD-10-CM | POA: Diagnosis not present

## 2021-07-14 DIAGNOSIS — M5386 Other specified dorsopathies, lumbar region: Secondary | ICD-10-CM | POA: Diagnosis not present

## 2021-07-14 DIAGNOSIS — M5432 Sciatica, left side: Secondary | ICD-10-CM | POA: Diagnosis not present

## 2021-07-14 DIAGNOSIS — R519 Headache, unspecified: Secondary | ICD-10-CM | POA: Diagnosis not present

## 2021-07-16 DIAGNOSIS — M5432 Sciatica, left side: Secondary | ICD-10-CM | POA: Diagnosis not present

## 2021-07-16 DIAGNOSIS — M5386 Other specified dorsopathies, lumbar region: Secondary | ICD-10-CM | POA: Diagnosis not present

## 2021-07-16 DIAGNOSIS — M9901 Segmental and somatic dysfunction of cervical region: Secondary | ICD-10-CM | POA: Diagnosis not present

## 2021-07-16 DIAGNOSIS — R519 Headache, unspecified: Secondary | ICD-10-CM | POA: Diagnosis not present

## 2021-07-21 ENCOUNTER — Other Ambulatory Visit (HOSPITAL_BASED_OUTPATIENT_CLINIC_OR_DEPARTMENT_OTHER): Payer: Self-pay | Admitting: Family Medicine

## 2021-07-23 ENCOUNTER — Other Ambulatory Visit: Payer: Self-pay

## 2021-07-23 ENCOUNTER — Ambulatory Visit (HOSPITAL_BASED_OUTPATIENT_CLINIC_OR_DEPARTMENT_OTHER): Payer: BC Managed Care – PPO | Admitting: Family Medicine

## 2021-07-23 ENCOUNTER — Encounter (HOSPITAL_BASED_OUTPATIENT_CLINIC_OR_DEPARTMENT_OTHER): Payer: Self-pay | Admitting: Family Medicine

## 2021-07-23 VITALS — BP 112/84 | HR 80 | Ht 62.0 in | Wt 181.4 lb

## 2021-07-23 DIAGNOSIS — F419 Anxiety disorder, unspecified: Secondary | ICD-10-CM

## 2021-07-23 MED ORDER — BUSPIRONE HCL 5 MG PO TABS: 5.0000 mg | ORAL_TABLET | Freq: Two times a day (BID) | ORAL | 1 refills | Status: DC

## 2021-07-23 NOTE — Patient Instructions (Addendum)
  Medication Instructions:  Your physician has recommended you make the following change in your medication:  -- START Buspirone (Buspar) 5 mg - Take 1 tablet (5 mg) by mouth twice daily -- RX SENT --If you need a refill on any your medications before your next appointment, please call your pharmacy first. If no refills are authorized on file call the office.--  Follow-Up: Your next appointment:   Your physician recommends that you schedule a follow-up appointment in: 2 WEEKS (virtually) with Dr. de Peru  Thanks for letting us be apart of your health journey!!  Primary Care and Sports Medicine   Dr. Ceasar Mons [place]   We encourage you to activate your patient portal called "MyChart".  Sign up information is provided on this After Visit Summary.  MyChart is used to connect with patients for Virtual Visits (Telemedicine).  Patients are able to view lab/test results, encounter notes, upcoming appointments, etc.  Non-urgent messages can be sent to your provider as well. To learn more about what you can do with MyChart, please visit --  [email or web address].

## 2021-07-23 NOTE — Assessment & Plan Note (Signed)
Reports that she generally has had some improvement with use of citalopram, notes about 50% improvement in symptoms. Still with daily symptoms of anxiety, using clonazepam about 2-3 times daily Has been working with behavioral health, 2 sessions thus far, has been utilizing techniques discussed at those visits Discussed options with patient, given partial response, we will proceed with augmentation with buspirone.  We will start at 5 mg twice daily and monitor response to this Plan for virtual visit follow-up in about 2 weeks to assess response If still symptomatic, consider titration of citalopram or buspirone

## 2021-07-23 NOTE — Progress Notes (Signed)
    Procedures performed today:    None.  Independent interpretation of notes and tests performed by another provider:   None.  Brief History, Exam, Impression, and Recommendations:    BP 112/84   Pulse 80   Ht 5\' 2"  (1.575 m)   Wt 181 lb 6.4 oz (82.3 kg)   LMP  (LMP Unknown)   SpO2 98%   BMI 33.18 kg/m   Anxiety Reports that she generally has had some improvement with use of citalopram, notes about 50% improvement in symptoms. Still with daily symptoms of anxiety, using clonazepam about 2-3 times daily Has been working with behavioral health, 2 sessions thus far, has been utilizing techniques discussed at those visits Discussed options with patient, given partial response, we will proceed with augmentation with buspirone.  We will start at 5 mg twice daily and monitor response to this Plan for virtual visit follow-up in about 2 weeks to assess response If still symptomatic, consider titration of citalopram or buspirone   ___________________________________________ Mykaela Arena de , MD, ABFM, [facility] Primary Care and Sports Medicine [facility]

## 2021-07-25 ENCOUNTER — Ambulatory Visit: Payer: BC Managed Care – PPO | Admitting: Psychologist

## 2021-07-28 DIAGNOSIS — R519 Headache, unspecified: Secondary | ICD-10-CM | POA: Diagnosis not present

## 2021-07-28 DIAGNOSIS — M5386 Other specified dorsopathies, lumbar region: Secondary | ICD-10-CM | POA: Diagnosis not present

## 2021-07-28 DIAGNOSIS — M25579 Pain in unspecified ankle and joints of unspecified foot: Secondary | ICD-10-CM | POA: Diagnosis not present

## 2021-07-28 DIAGNOSIS — M722 Plantar fascial fibromatosis: Secondary | ICD-10-CM | POA: Diagnosis not present

## 2021-07-28 DIAGNOSIS — M9901 Segmental and somatic dysfunction of cervical region: Secondary | ICD-10-CM | POA: Diagnosis not present

## 2021-07-28 DIAGNOSIS — M5432 Sciatica, left side: Secondary | ICD-10-CM | POA: Diagnosis not present

## 2021-07-28 DIAGNOSIS — M2140 Flat foot [pes planus] (acquired), unspecified foot: Secondary | ICD-10-CM | POA: Diagnosis not present

## 2021-07-30 DIAGNOSIS — M5386 Other specified dorsopathies, lumbar region: Secondary | ICD-10-CM | POA: Diagnosis not present

## 2021-07-30 DIAGNOSIS — M5432 Sciatica, left side: Secondary | ICD-10-CM | POA: Diagnosis not present

## 2021-07-30 DIAGNOSIS — M9901 Segmental and somatic dysfunction of cervical region: Secondary | ICD-10-CM | POA: Diagnosis not present

## 2021-07-30 DIAGNOSIS — R519 Headache, unspecified: Secondary | ICD-10-CM | POA: Diagnosis not present

## 2021-08-05 ENCOUNTER — Other Ambulatory Visit: Payer: Self-pay

## 2021-08-05 ENCOUNTER — Encounter (HOSPITAL_BASED_OUTPATIENT_CLINIC_OR_DEPARTMENT_OTHER): Payer: Self-pay | Admitting: Family Medicine

## 2021-08-05 ENCOUNTER — Ambulatory Visit (INDEPENDENT_AMBULATORY_CARE_PROVIDER_SITE_OTHER): Payer: BC Managed Care – PPO | Admitting: Family Medicine

## 2021-08-05 DIAGNOSIS — F419 Anxiety disorder, unspecified: Secondary | ICD-10-CM

## 2021-08-05 DIAGNOSIS — M9901 Segmental and somatic dysfunction of cervical region: Secondary | ICD-10-CM | POA: Diagnosis not present

## 2021-08-05 DIAGNOSIS — M5386 Other specified dorsopathies, lumbar region: Secondary | ICD-10-CM | POA: Diagnosis not present

## 2021-08-05 DIAGNOSIS — R519 Headache, unspecified: Secondary | ICD-10-CM | POA: Diagnosis not present

## 2021-08-05 DIAGNOSIS — M5432 Sciatica, left side: Secondary | ICD-10-CM | POA: Diagnosis not present

## 2021-08-05 MED ORDER — BUSPIRONE HCL 5 MG PO TABS: 5.0000 mg | ORAL_TABLET | Freq: Three times a day (TID) | ORAL | 1 refills | Status: DC

## 2021-08-05 NOTE — Progress Notes (Signed)
   Virtual Visit via Telephone   I connected with  Kaitlyn Guzman  on 08/05/21 by telephone/telehealth and verified that I am speaking with the correct person using two identifiers.  I discussed the limitations, risks, security and privacy concerns of performing an evaluation and management service by telephone, including the higher likelihood of inaccurate diagnosis and treatment, and the availability of in person appointments.  We also discussed the likely need of an additional face to face encounter for complete and high quality delivery of care.  I also discussed with the patient that there may be a patient responsible charge related to this service. The patient expressed understanding and wishes to proceed.  Provider location is in medical facility. Patient location is at their home, different from provider location. People involved in care of the patient during this telehealth encounter were myself, my nurse/medical assistant, and my front office/scheduling team member.  Procedures performed today:    None.  Independent interpretation of notes and tests performed by another provider:   None.  Brief History, Exam, Impression, and Recommendations:    General: Speaking full sentences, no audible heavy breathing.  Sounds alert and appropriately interactive  Anxiety Patient reports that she is doing well.  Feels about 65% improved, was at 50% at last visit Has been taking buspirone as prescribed, feels that this has been beneficial She has had decreased need of the clonazepam, reports that she is now taking this every other day Continues to work with behavioral health, counseling, this is going well Discussed options with patient, will proceed with slight increase in total daily dose of the buspirone, will increase from twice daily to 3 times daily, continue with 5 mg tablets Continue with citalopram Plan for follow-up in about 3 to 4 weeks to monitor progress.  This can be virtual  or in office.  I discussed the above assessment and treatment plan with the patient. The patient was provided an opportunity to ask questions and all were answered. The patient agreed with the plan and demonstrated an understanding of the instructions.   The patient was advised to call back or seek an in-person evaluation if the symptoms worsen or if the condition fails to improve as anticipated.   I provided 20 minutes of face to face and non-face-to-face time during this encounter date, time was needed to gather information, review chart, records, communicate/coordinate with staff remotely, as well as complete documentation.   ___________________________________________ Shasha Buchbinder de [place], MD, ABFM, CAQSM Primary Care and Sports Medicine [facility]

## 2021-08-05 NOTE — Assessment & Plan Note (Signed)
Patient reports that she is doing well.  Feels about 65% improved, was at 50% at last visit Has been taking buspirone as prescribed, feels that this has been beneficial She has had decreased need of the clonazepam, reports that she is now taking this every other day Continues to work with behavioral health, counseling, this is going well Discussed options with patient, will proceed with slight increase in total daily dose of the buspirone, will increase from twice daily to 3 times daily, continue with 5 mg tablets Continue with citalopram Plan for follow-up in about 3 to 4 weeks to monitor progress.  This can be virtual or in office.

## 2021-08-06 ENCOUNTER — Telehealth (HOSPITAL_BASED_OUTPATIENT_CLINIC_OR_DEPARTMENT_OTHER): Payer: BC Managed Care – PPO | Admitting: Family Medicine

## 2021-08-06 DIAGNOSIS — Z1231 Encounter for screening mammogram for malignant neoplasm of breast: Secondary | ICD-10-CM | POA: Diagnosis not present

## 2021-08-11 DIAGNOSIS — M9901 Segmental and somatic dysfunction of cervical region: Secondary | ICD-10-CM | POA: Diagnosis not present

## 2021-08-11 DIAGNOSIS — M5432 Sciatica, left side: Secondary | ICD-10-CM | POA: Diagnosis not present

## 2021-08-11 DIAGNOSIS — R519 Headache, unspecified: Secondary | ICD-10-CM | POA: Diagnosis not present

## 2021-08-11 DIAGNOSIS — M5386 Other specified dorsopathies, lumbar region: Secondary | ICD-10-CM | POA: Diagnosis not present

## 2021-08-13 DIAGNOSIS — R519 Headache, unspecified: Secondary | ICD-10-CM | POA: Diagnosis not present

## 2021-08-13 DIAGNOSIS — M5432 Sciatica, left side: Secondary | ICD-10-CM | POA: Diagnosis not present

## 2021-08-13 DIAGNOSIS — Z20822 Contact with and (suspected) exposure to covid-19: Secondary | ICD-10-CM | POA: Diagnosis not present

## 2021-08-13 DIAGNOSIS — M9901 Segmental and somatic dysfunction of cervical region: Secondary | ICD-10-CM | POA: Diagnosis not present

## 2021-08-13 DIAGNOSIS — M5386 Other specified dorsopathies, lumbar region: Secondary | ICD-10-CM | POA: Diagnosis not present

## 2021-08-18 DIAGNOSIS — R519 Headache, unspecified: Secondary | ICD-10-CM | POA: Diagnosis not present

## 2021-08-18 DIAGNOSIS — M9901 Segmental and somatic dysfunction of cervical region: Secondary | ICD-10-CM | POA: Diagnosis not present

## 2021-08-18 DIAGNOSIS — M5386 Other specified dorsopathies, lumbar region: Secondary | ICD-10-CM | POA: Diagnosis not present

## 2021-08-18 DIAGNOSIS — M5432 Sciatica, left side: Secondary | ICD-10-CM | POA: Diagnosis not present

## 2021-08-25 ENCOUNTER — Encounter (HOSPITAL_BASED_OUTPATIENT_CLINIC_OR_DEPARTMENT_OTHER): Payer: Self-pay

## 2021-08-25 ENCOUNTER — Emergency Department (HOSPITAL_BASED_OUTPATIENT_CLINIC_OR_DEPARTMENT_OTHER)
Admission: EM | Admit: 2021-08-25 | Discharge: 2021-08-25 | Disposition: A | Discharge status: Home / Self Care | Payer: BC Managed Care – PPO | Attending: Emergency Medicine | Admitting: Emergency Medicine

## 2021-08-25 DIAGNOSIS — D649 Anemia, unspecified: Secondary | ICD-10-CM | POA: Diagnosis not present

## 2021-08-25 DIAGNOSIS — D62 Acute posthemorrhagic anemia: Secondary | ICD-10-CM | POA: Diagnosis not present

## 2021-08-25 DIAGNOSIS — F419 Anxiety disorder, unspecified: Secondary | ICD-10-CM | POA: Diagnosis not present

## 2021-08-25 DIAGNOSIS — R112 Nausea with vomiting, unspecified: Secondary | ICD-10-CM

## 2021-08-25 DIAGNOSIS — R079 Chest pain, unspecified: Secondary | ICD-10-CM | POA: Diagnosis not present

## 2021-08-25 DIAGNOSIS — R0902 Hypoxemia: Secondary | ICD-10-CM | POA: Diagnosis not present

## 2021-08-25 DIAGNOSIS — I959 Hypotension, unspecified: Secondary | ICD-10-CM | POA: Diagnosis not present

## 2021-08-25 DIAGNOSIS — R9431 Abnormal electrocardiogram [ECG] [EKG]: Secondary | ICD-10-CM | POA: Diagnosis not present

## 2021-08-25 DIAGNOSIS — R0789 Other chest pain: Secondary | ICD-10-CM | POA: Diagnosis not present

## 2021-08-25 DIAGNOSIS — R0602 Shortness of breath: Secondary | ICD-10-CM | POA: Diagnosis not present

## 2021-08-25 LAB — CBC WITH DIFFERENTIAL/PLATELET
Abs Immature Granulocytes: 0.02 10*3/uL (ref 0.00–0.07)
Basophils Absolute: 0.1 10*3/uL (ref 0.0–0.1)
Basophils Relative: 1 %
Eosinophils Absolute: 0 10*3/uL (ref 0.0–0.5)
Eosinophils Relative: 1 %
HCT: 29.1 % — ABNORMAL LOW (ref 36.0–46.0)
Hemoglobin: 9.5 g/dL — ABNORMAL LOW (ref 12.0–15.0)
Immature Granulocytes: 0 %
Lymphocytes Relative: 15 %
Lymphs Abs: 1.1 10*3/uL (ref 0.7–4.0)
MCH: 30.5 pg (ref 26.0–34.0)
MCHC: 32.6 g/dL (ref 30.0–36.0)
MCV: 93.6 fL (ref 80.0–100.0)
Monocytes Absolute: 0.3 10*3/uL (ref 0.1–1.0)
Monocytes Relative: 4 %
Neutro Abs: 5.9 10*3/uL (ref 1.7–7.7)
Neutrophils Relative %: 79 %
Platelets: 299 10*3/uL (ref 150–400)
RBC: 3.11 MIL/uL — ABNORMAL LOW (ref 3.87–5.11)
RDW: 14.6 % (ref 11.5–15.5)
WBC: 7.4 10*3/uL (ref 4.0–10.5)
nRBC: 0 % (ref 0.0–0.2)

## 2021-08-25 LAB — BASIC METABOLIC PANEL
Anion gap: 9 (ref 5–15)
BUN: 15 mg/dL (ref 6–20)
CO2: 23 mmol/L (ref 22–32)
Calcium: 8.8 mg/dL — ABNORMAL LOW (ref 8.9–10.3)
Chloride: 106 mmol/L (ref 98–111)
Creatinine, Ser: 0.53 mg/dL (ref 0.44–1.00)
GFR, Estimated: >60 mL/min (ref >60)
Glucose, Bld: 107 mg/dL — ABNORMAL HIGH (ref 70–99)
Potassium: 4 mmol/L (ref 3.5–5.1)
Sodium: 138 mmol/L (ref 135–145)

## 2021-08-25 LAB — TROPONIN I (HIGH SENSITIVITY): Troponin I (High Sensitivity): 3 ng/L (ref <18)

## 2021-08-25 LAB — LIPASE, BLOOD: Lipase: 31 U/L (ref 11–51)

## 2021-08-25 MED ORDER — SODIUM CHLORIDE 0.9 % IV BOLUS
500.0000 mL | Freq: Once | INTRAVENOUS | Status: AC
  Administered 2021-08-25: 500 mL via INTRAVENOUS

## 2021-08-25 MED ORDER — LORAZEPAM 1 MG PO TABS
1.0000 mg | ORAL_TABLET | Freq: Once | ORAL | Status: AC
  Administered 2021-08-25: 1 mg via ORAL
  Filled 2021-08-25: qty 1

## 2021-08-25 MED ORDER — ONDANSETRON HCL 4 MG/2ML IJ SOLN
4.0000 mg | Freq: Once | INTRAMUSCULAR | Status: DC
  Filled 2021-08-25: qty 2

## 2021-08-25 MED ORDER — ONDANSETRON HCL 4 MG/2ML IJ SOLN
INTRAMUSCULAR | Status: AC
  Filled 2021-08-25: qty 2

## 2021-08-25 NOTE — ED Triage Notes (Signed)
Pt is present for chest palpitations, SOB, feeling 'anxious', nausea/vomiting that started within the last two hours. Hx of anxiety and states this feels similar. Pt takes Buspar, Celexa, and Klonopin with no missed doses. Pt states the feeling came on all of the sudden while lying in bed.

## 2021-08-25 NOTE — Discharge Instructions (Addendum)
You were seen today for increasing anxiety.  Your work-up is largely reassuring.  You do have some postop anemia which is likely related to your recent surgery.  Otherwise your work-up is reassuring.  It is unclear what your trigger was.  Make sure that you are taking your medications as prescribed.  Do not take more than the prescribed antibiotics.  Follow-up closely with your general surgeon and your primary physician.  You may benefit from a counselor or therapist.

## 2021-08-25 NOTE — ED Provider Notes (Signed)
[facility] EMERGENCY DEPT Provider Note   CSN: [id number] Arrival date & time: 08/25/21  0126     History Chief Complaint  Patient presents with   Anxiety   Palpitations    Kaitlyn Guzman is a 45 y.o. female.  HPI     This a 45 year old female who presents with palpitations shortness of breath.  Patient reports onset of symptoms tonight.  She had some nausea and vomiting.  After that episode, she developed palpitations, shortness of breath, and increasing anxiety.  She states that she gets Panic attacks and this 1 felt similar; however lasted much longer.  She still feels like her heart is racing.  She was seen and evaluated in July.  She followed up with her primary physician and was started on BuSpar, Celexa, and Klonopin.  She has had adjustments in her BuSpar and Celexa.  Of note, this week she had an elective outpatient abdominal surgery for skin removal and a tummy tuck.  She states that she has not had any abdominal pain other than what she would consider normal postoperative soreness.  She does have a drain which has slowed and output.  She is not had any fevers.  She states overall she is otherwise felt well.  She is unsure what her trigger may be.  She does report that she missed her antibiotics for 2 days and took 1 extra dose yesterday and today because of this.  Past Medical History:  Diagnosis Date   Anxiety    Depression     Patient Active Problem List   Diagnosis Date Noted   Anxiety 06/11/2021   Elevated blood pressure reading in office without diagnosis of hypertension 06/11/2021    Past Surgical History:  Procedure Laterality Date   BREAST REDUCTION SURGERY Bilateral    CHOLECYSTECTOMY     LIPOSUCTION     08/19/2021   TONSILLECTOMY       OB History     Gravida      Para      Term      Preterm      AB      Living  1      SAB      IAB      Ectopic      Multiple      Live Births              Family History   Problem Relation Age of Onset   Hypertension Mother    Diabetes Mother    Diabetes Father    Hypertension Father    Cancer Father    Diabetes Maternal Grandmother    Hypertension Maternal Grandmother    Heart disease Maternal Grandmother    Gout Maternal Grandfather    Diabetes Maternal Grandfather    Hypertension Maternal Grandfather     Social History   Tobacco Use   Smoking status: Never    Passive exposure: Past   Smokeless tobacco: Never  Vaping Use   Vaping Use: Never used  Substance Use Topics   Alcohol use: Not Currently    Alcohol/week: 7.0 standard drinks    Types: 7 Glasses of wine per week   Drug use: Never    Home Medications Prior to Admission medications   Medication Sig Start Date End Date Taking? Authorizing Provider  busPIRone (BUSPAR) 5 MG tablet Take 1 tablet (5 mg total) by mouth 3 (three) times daily. 08/05/21   de [place], Raymond J, MD  citalopram (CELEXA) 20 MG  tablet TAKE 1 TABLET BY MOUTH EVERY DAY 07/23/21   de [place], Buren Kos, MD  clonazePAM (KLONOPIN) 0.5 MG tablet TAKE 1 TABLET BY MOUTH THREE TIMES A DAY AS NEEDED FOR ANXIETY 07/04/21   de [place], Buren Kos, MD    Allergies    Sulfa antibiotics and Sulfadiazine  Review of Systems   Review of Systems  Constitutional:  Negative for fever.  Respiratory:  Positive for shortness of breath. Negative for chest tightness.   Cardiovascular:  Positive for palpitations. Negative for leg swelling.  Gastrointestinal:  Negative for abdominal pain, nausea and vomiting.  Genitourinary:  Negative for dysuria.  All other systems reviewed and are negative.  Physical Exam Updated Vital Signs BP 112/65   Pulse 82   Temp 98.1 F (36.7 C) (Oral)   Resp 18   Ht 1.575 m (5\' 2" )   Wt 83.9 kg   LMP 08/18/2021   SpO2 97%   BMI 33.84 kg/m   Physical Exam Vitals and nursing note reviewed.  Constitutional:      Appearance: She is well-developed. She is not ill-appearing.  HENT:     Head: Normocephalic and  atraumatic.     Nose: Nose normal.     Mouth/Throat:     Mouth: Mucous membranes are moist.  Eyes:     Pupils: Pupils are equal, round, and reactive to light.  Cardiovascular:     Rate and Rhythm: Normal rate and regular rhythm.     Heart sounds: Normal heart sounds.  Pulmonary:     Effort: Pulmonary effort is normal. No respiratory distress.     Breath sounds: No wheezing.  Abdominal:     General: Bowel sounds are normal.     Palpations: Abdomen is soft.     Tenderness: There is no abdominal tenderness.     Comments: Incision over lower abdomen clean dry and intact, no significant abdominal tenderness, JP drain right abdomen with blood-tinged serosanguineous fluid  Musculoskeletal:     Cervical back: Neck supple.  Skin:    General: Skin is warm and dry.  Neurological:     Mental Status: She is alert and oriented to person, place, and time.  Psychiatric:     Comments: Anxious appearing    ED Results / Procedures / Treatments   Labs (all labs ordered are listed, but only abnormal results are displayed) Labs Reviewed  CBC WITH DIFFERENTIAL/PLATELET - Abnormal; Notable for the following components:      Result Value   RBC 3.11 (*)    Hemoglobin 9.5 (*)    HCT 29.1 (*)    All other components within normal limits  BASIC METABOLIC PANEL - Abnormal; Notable for the following components:   Glucose, Bld 107 (*)    Calcium 8.8 (*)    All other components within normal limits  LIPASE, BLOOD  TROPONIN I (HIGH SENSITIVITY)  TROPONIN I (HIGH SENSITIVITY)    EKG EKG Interpretation  Date/Time:  Monday August 25 2021 01:44:51 EDT Ventricular Rate:  85 PR Interval:  140 QRS Duration: 84 QT Interval:  362 QTC Calculation: 430 R Axis:   4 Text Interpretation: Normal sinus rhythm Cannot rule out Anterior infarct , age undetermined Abnormal ECG No significant change since last tracing Confirmed by 08-22-1991 (Ross Marcus) on 08/25/2021 3:08:42 AM  Radiology No results  found.  Procedures Procedures   Medications Ordered in ED Medications  LORazepam (ATIVAN) tablet 1 mg (1 mg Oral Given 08/25/21 0408)  sodium chloride 0.9 % bolus 500  mL (500 mLs Intravenous New Bag/Given 08/25/21 0408)    ED Course  I have reviewed the triage vital signs and the nursing notes.  Pertinent labs & imaging results that were available during my care of the patient were reviewed by me and considered in my medical decision making (see chart for details).    MDM Rules/Calculators/A&P                           Patient presents with anxiety.  Also had some nausea and vomiting.  She is nontoxic and vital signs are reassuring.  She seems very anxious about her symptoms.  It seems that her symptoms started with nausea and vomiting.  She did recently take some additional antibiotics which can sometimes cause nausea, vomiting, and diarrhea.  EKG shows no evidence of acute ischemia or arrhythmia.  She is not hypoxic.  She did have a recent surgery but highly doubt PE.  Labs obtained and reviewed.  Only significant lab derangement is anemia with a hemoglobin of 9.5 down from normal.  With recent surgery and her JP drain output, this is likely related.  She does not appear to be acutely bleeding and her vital signs are stable.  Low suspicion also for SBO as her abdomen is soft.  Patient was given fluids and Ativan.  She states significant improvement with this intervention.  We had a long discussion about taking medications as prescribed.  She needs to follow-up with her primary doctor and her surgeon.  Additionally, recommended that she may benefit from a therapist.  Patient stated understanding.  After history, exam, and medical workup I feel the patient has been appropriately medically screened and is safe for discharge home. Pertinent diagnoses were discussed with the patient. Patient was given return precautions.  Final Clinical Impression(s) / ED Diagnoses Final diagnoses:  Anxiety   Non-intractable vomiting with nausea, unspecified vomiting type  Anemia following surgery    Rx / DC Orders ED Discharge Orders     None        Shon Baton, MD 08/25/21 [phone]

## 2021-08-26 ENCOUNTER — Other Ambulatory Visit (HOSPITAL_BASED_OUTPATIENT_CLINIC_OR_DEPARTMENT_OTHER): Payer: Self-pay | Admitting: Family Medicine

## 2021-09-02 ENCOUNTER — Ambulatory Visit (HOSPITAL_BASED_OUTPATIENT_CLINIC_OR_DEPARTMENT_OTHER): Payer: BC Managed Care – PPO | Admitting: Family Medicine

## 2021-09-02 ENCOUNTER — Encounter (HOSPITAL_BASED_OUTPATIENT_CLINIC_OR_DEPARTMENT_OTHER): Payer: Self-pay

## 2021-09-13 ENCOUNTER — Other Ambulatory Visit (HOSPITAL_BASED_OUTPATIENT_CLINIC_OR_DEPARTMENT_OTHER): Payer: Self-pay | Admitting: Family Medicine

## 2021-09-17 ENCOUNTER — Encounter (HOSPITAL_BASED_OUTPATIENT_CLINIC_OR_DEPARTMENT_OTHER): Payer: Self-pay | Admitting: Family Medicine

## 2021-10-07 ENCOUNTER — Other Ambulatory Visit (HOSPITAL_BASED_OUTPATIENT_CLINIC_OR_DEPARTMENT_OTHER): Payer: Self-pay | Admitting: Family Medicine

## 2021-10-07 MED ORDER — BUSPIRONE HCL 5 MG PO TABS
5.0000 mg | ORAL_TABLET | Freq: Three times a day (TID) | ORAL | 0 refills | Status: DC
Start: 2021-10-07 — End: 2021-11-05

## 2021-10-12 ENCOUNTER — Other Ambulatory Visit (HOSPITAL_BASED_OUTPATIENT_CLINIC_OR_DEPARTMENT_OTHER): Payer: Self-pay | Admitting: Family Medicine

## 2021-11-02 ENCOUNTER — Other Ambulatory Visit (HOSPITAL_BASED_OUTPATIENT_CLINIC_OR_DEPARTMENT_OTHER): Payer: Self-pay | Admitting: Family Medicine

## 2021-11-05 ENCOUNTER — Other Ambulatory Visit: Payer: Self-pay

## 2021-11-05 ENCOUNTER — Encounter (HOSPITAL_BASED_OUTPATIENT_CLINIC_OR_DEPARTMENT_OTHER): Payer: Self-pay | Admitting: Family Medicine

## 2021-11-05 ENCOUNTER — Ambulatory Visit (INDEPENDENT_AMBULATORY_CARE_PROVIDER_SITE_OTHER): Payer: BC Managed Care – PPO | Admitting: Family Medicine

## 2021-11-05 VITALS — BP 130/90 | HR 71 | Ht 62.0 in | Wt 182.6 lb

## 2021-11-05 DIAGNOSIS — F419 Anxiety disorder, unspecified: Secondary | ICD-10-CM

## 2021-11-05 DIAGNOSIS — Z0000 Encounter for general adult medical examination without abnormal findings: Secondary | ICD-10-CM

## 2021-11-05 NOTE — Assessment & Plan Note (Addendum)
Reports that she has been doing well with current regimen of buspirone and citalopram.  Also has clonazepam, however reports that she has used it only about 3 times over the past month or so.  Requesting refills of buspirone and citalopram.  Also continues with counseling, feels that this has been helpful, encouraged to continue.

## 2021-11-05 NOTE — Patient Instructions (Signed)
  Medication Instructions:  Your physician recommends that you continue on your current medications as directed. Please refer to the Current Medication list given to you today. --If you need a refill on any your medications before your next appointment, please call your pharmacy first. If no refills are authorized on file call the office.-- Lab Work: Your physician has recommended that you have lab work today: CBC, CMP, Lipid, A1C, and Thyroid Panel If you have labs (blood work) drawn today and your tests are completely normal, you will receive your results via MyChart message OR a phone call from our staff.  Please ensure you check your voicemail in the event that you authorized detailed messages to be left on a delegated number. If you have any lab test that is abnormal or we need to change your treatment, we will call you to review the results.  Follow-Up: Your next appointment:   Your physician recommends that you schedule a follow-up appointment in: 2-3 WEEKS for CPE with Dr. de [place]  You will receive a text message or e-mail with a link to a survey about your care and experience with [place] today! We would greatly appreciate your feedback!   Thanks for letting us be apart of your health journey!!  Primary Care and Sports Medicine   Dr. Ceasar Mons Peru   We encourage you to activate your patient portal called "MyChart".  Sign up information is provided on this After Visit Summary.  MyChart is used to connect with patients for Virtual Visits (Telemedicine).  Patients are able to view lab/test results, encounter notes, upcoming appointments, etc.  Non-urgent messages can be sent to your provider as well. To learn more about what you can do with MyChart, please visit --  [email or web address].

## 2021-11-05 NOTE — Progress Notes (Signed)
    Procedures performed today:    None.  Independent interpretation of notes and tests performed by another provider:   None.  Brief History, Exam, Impression, and Recommendations:    BP 130/90   Pulse 71   Ht 5\' 2"  (1.575 m)   Wt 182 lb 9.6 oz (82.8 kg)   SpO2 (!) 71%   BMI 33.40 kg/m   Anxiety Reports that she has been doing well with current regimen of buspirone and citalopram.  Also has clonazepam, however reports that she has used it only about 3 times over the past month or so.  Requesting refills of buspirone and citalopram.  Also continues with counseling, feels that this has been helpful, encouraged to continue.  Plan for follow-up in about 2 to 3 weeks for CPE.  Labs to be completed today.  We will request records from most recent PCP in where she most recently had Pap smear completed.   ___________________________________________ Kaitlyn Guzman de [place], MD, ABFM, CAQSM Primary Care and Sports Medicine [facility]

## 2021-11-06 LAB — CBC WITH DIFFERENTIAL/PLATELET
Basophils Absolute: 0.1 10*3/uL (ref 0.0–0.2)
Basos: 1 %
EOS (ABSOLUTE): 0.1 10*3/uL (ref 0.0–0.4)
Eos: 2 %
Hematocrit: 41.4 % (ref 34.0–46.6)
Hemoglobin: 14.2 g/dL (ref 11.1–15.9)
Immature Grans (Abs): 0 10*3/uL (ref 0.0–0.1)
Immature Granulocytes: 0 %
Lymphocytes Absolute: 1.6 10*3/uL (ref 0.7–3.1)
Lymphs: 38 %
MCH: 30.1 pg (ref 26.6–33.0)
MCHC: 34.3 g/dL (ref 31.5–35.7)
MCV: 88 fL (ref 79–97)
Monocytes Absolute: 0.4 10*3/uL (ref 0.1–0.9)
Monocytes: 8 %
Neutrophils Absolute: 2.2 10*3/uL (ref 1.4–7.0)
Neutrophils: 51 %
Platelets: 347 10*3/uL (ref 150–450)
RBC: 4.72 x10E6/uL (ref 3.77–5.28)
RDW: 12.6 % (ref 11.7–15.4)
WBC: 4.3 10*3/uL (ref 3.4–10.8)

## 2021-11-06 LAB — COMPREHENSIVE METABOLIC PANEL
ALT: 8 IU/L (ref 0–32)
AST: 30 IU/L (ref 0–40)
Albumin/Globulin Ratio: 1.6 (ref 1.2–2.2)
Albumin: 4.3 g/dL (ref 3.8–4.8)
Alkaline Phosphatase: 60 IU/L (ref 44–121)
BUN/Creatinine Ratio: 14 (ref 9–23)
BUN: 11 mg/dL (ref 6–24)
Bilirubin Total: 0.4 mg/dL (ref 0.0–1.2)
CO2: 24 mmol/L (ref 20–29)
Calcium: 9.5 mg/dL (ref 8.7–10.2)
Chloride: 102 mmol/L (ref 96–106)
Creatinine, Ser: 0.8 mg/dL (ref 0.57–1.00)
Globulin, Total: 2.7 g/dL (ref 1.5–4.5)
Glucose: 87 mg/dL (ref 70–99)
Sodium: 141 mmol/L (ref 134–144)
Total Protein: 7 g/dL (ref 6.0–8.5)
eGFR: 93 mL/min/{1.73_m2} (ref >59)

## 2021-11-06 LAB — HEMOGLOBIN A1C
Est. average glucose Bld gHb Est-mCnc: 100 mg/dL
Hgb A1c MFr Bld: 5.1 % (ref 4.8–5.6)

## 2021-11-06 LAB — LIPID PANEL
Chol/HDL Ratio: 2.2 ratio (ref 0.0–4.4)
Cholesterol, Total: 186 mg/dL (ref 100–199)
HDL: 86 mg/dL (ref >39)
LDL Chol Calc (NIH): 91 mg/dL (ref 0–99)
Triglycerides: 45 mg/dL (ref 0–149)
VLDL Cholesterol Cal: 9 mg/dL (ref 5–40)

## 2021-11-06 LAB — TSH RFX ON ABNORMAL TO FREE T4: TSH: 1.15 u[IU]/mL (ref 0.450–4.500)

## 2021-11-12 ENCOUNTER — Encounter (HOSPITAL_BASED_OUTPATIENT_CLINIC_OR_DEPARTMENT_OTHER): Payer: Self-pay

## 2021-11-30 IMAGING — DX DG CHEST 1V PORT
1 series · 1 of 1 positions shown · non-contrast
Comparison: None.

CLINICAL DATA: Chest pain.  Anxiety attack.

EXAM:
PORTABLE CHEST 1 VIEW

[chest]
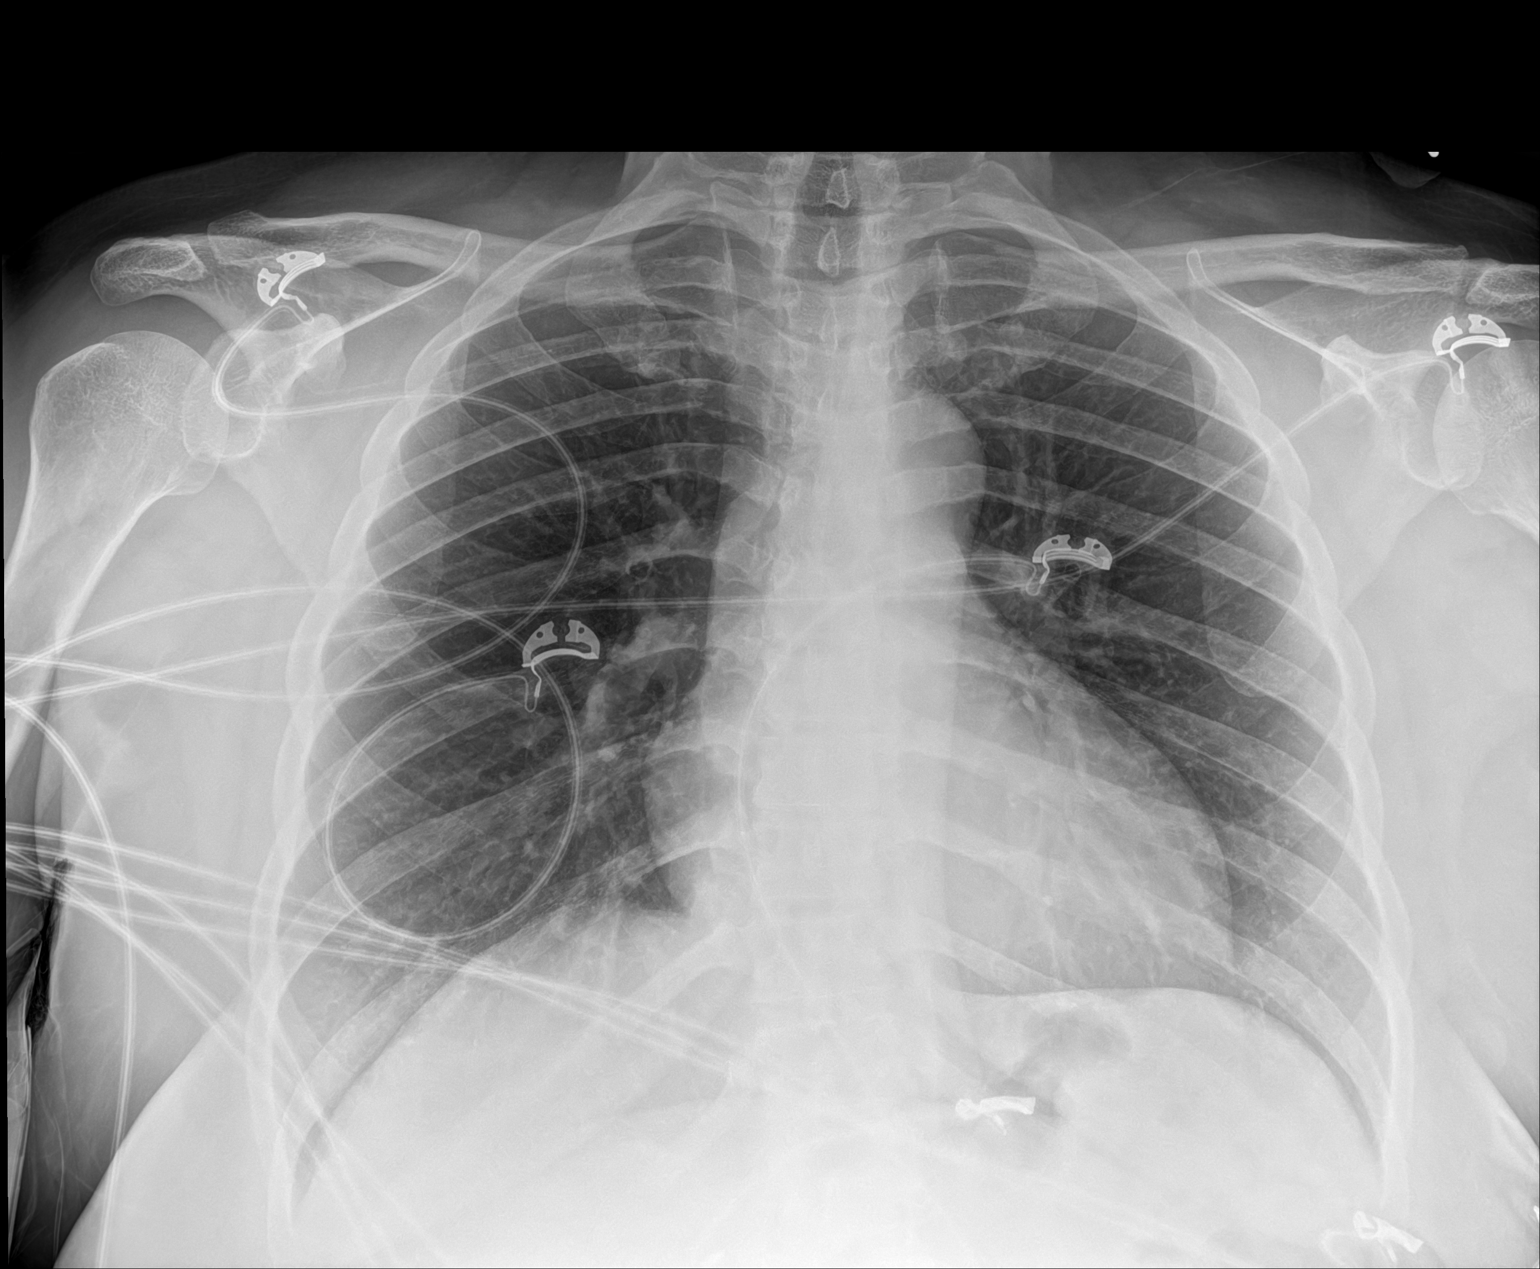

[1 of 1 positions shown; findings below may reference images not displayed]

FINDINGS: The heart size and mediastinal contours are within normal limits.
Both lungs are clear. The visualized skeletal structures are
unremarkable.
IMPRESSION: No active disease.

## 2021-12-12 ENCOUNTER — Ambulatory Visit (INDEPENDENT_AMBULATORY_CARE_PROVIDER_SITE_OTHER): Payer: BC Managed Care – PPO | Admitting: Family Medicine

## 2021-12-12 ENCOUNTER — Other Ambulatory Visit: Payer: Self-pay

## 2021-12-12 ENCOUNTER — Other Ambulatory Visit (HOSPITAL_COMMUNITY)
Admission: RE | Admit: 2021-12-12 | Discharge: 2021-12-12 | Disposition: A | Discharge status: Home / Self Care | Payer: BC Managed Care – PPO | Source: Ambulatory Visit | Attending: Family Medicine | Admitting: Family Medicine

## 2021-12-12 ENCOUNTER — Encounter (HOSPITAL_BASED_OUTPATIENT_CLINIC_OR_DEPARTMENT_OTHER): Payer: Self-pay | Admitting: Family Medicine

## 2021-12-12 VITALS — BP 122/88 | HR 74 | Ht 62.0 in | Wt 179.0 lb

## 2021-12-12 DIAGNOSIS — Z124 Encounter for screening for malignant neoplasm of cervix: Secondary | ICD-10-CM | POA: Insufficient documentation

## 2021-12-12 DIAGNOSIS — F419 Anxiety disorder, unspecified: Secondary | ICD-10-CM

## 2021-12-12 DIAGNOSIS — Z0000 Encounter for general adult medical examination without abnormal findings: Secondary | ICD-10-CM

## 2021-12-12 MED ORDER — BUSPIRONE HCL 10 MG PO TABS: 10.0000 mg | ORAL_TABLET | Freq: Two times a day (BID) | ORAL | 1 refills | Status: DC

## 2021-12-12 NOTE — Patient Instructions (Addendum)
°  Medication Instructions:  Your physician has recommended you make the following change in your medication:  -- INCREASE Buspar to 10 mg - take 1 tablet by mouth twice daily --If you need a refill on any your medications before your next appointment, please call your pharmacy first. If no refills are authorized on file call the office.-- Follow-Up: Your next appointment:   Your physician recommends that you schedule a follow-up appointment in: 3-4 WEEKS with Dr. de Peru  You will receive a text message or e-mail with a link to a survey about your care and experience with [place] today! We would greatly appreciate your feedback!   Thanks for letting us be apart of your health journey!!  Primary Care and Sports Medicine   Dr. Ceasar Mons [place]   We encourage you to activate your patient portal called "MyChart".  Sign up information is provided on this After Visit Summary.  MyChart is used to connect with patients for Virtual Visits (Telemedicine).  Patients are able to view lab/test results, encounter notes, upcoming appointments, etc.  Non-urgent messages can be sent to your provider as well. To learn more about what you can do with MyChart, please visit --  [email or web address].

## 2021-12-12 NOTE — Assessment & Plan Note (Signed)
Has had some increased symptoms over the past 2 to 3 weeks due to ongoing health concerns with her ex-husband who she reports has end-stage renal disease.  Due to his medical condition, he anticipates being placed into hospice in the near future.  As a result of things, she has been taking clonazepam more frequently recently.  Has had some trouble with sleep as well.  Other medications this time include citalopram and buspirone Discussed options with patient, will titrate dose of buspirone from 5 mg 3 times daily to 10 mg twice daily to try to help control symptoms

## 2021-12-12 NOTE — Assessment & Plan Note (Addendum)
The patient was counseled, risk factors were discussed, anticipatory guidance given. Reviewed recent records from prior Pap smear which was completed in 2016.  Also had HPV cotesting.  Pap smear and HPV results were normal, indicating 5-year follow-up recommended.  Discussed updated cervical cancer screening would be due at this time, patient interested, completed today, will await results Reviewed recent labs from December 2022 which were unremarkable Does need to complete dental and vision screenings, last completed in 2020, recommend following up with providers related to this Typically she gets influenza vaccine through her work, has not done so the season, offered to complete today, patient defers and would prefer to have completed through her work Patient is up-to-date on tetanus Recommend continuing with healthy lifestyle modifications including dietary changes and exercise regimen

## 2021-12-12 NOTE — Progress Notes (Signed)
Subjective:    CC: Annual Physical Exam  HPI:  Kaitlyn Guzman is a 46 y.o. presenting for annual physical  I reviewed the past medical history, family history, social history, surgical history, and allergies today and no changes were needed.  Please see the problem list section below in epic for further details.  Past Medical History: Past Medical History:  Diagnosis Date   Anxiety    Depression    Past Surgical History: Past Surgical History:  Procedure Laterality Date   BREAST REDUCTION SURGERY Bilateral    CHOLECYSTECTOMY     LIPOSUCTION     08/19/2021   TONSILLECTOMY     Social History: Social History   Socioeconomic History   Marital status: Single    Spouse name: Not on file   Number of children: Not on file   Years of education: Not on file   Highest education level: Not on file  Occupational History   Not on file  Tobacco Use   Smoking status: Never    Passive exposure: Past   Smokeless tobacco: Never  Vaping Use   Vaping Use: Never used  Substance and Sexual Activity   Alcohol use: Not Currently    Alcohol/week: 7.0 standard drinks    Types: 7 Glasses of wine per week   Drug use: Never   Sexual activity: Yes  Other Topics Concern   Not on file  Social History Narrative   Not on file   Social Determinants of Health   Financial Resource Strain: Not on file  Food Insecurity: Not on file  Transportation Needs: Not on file  Physical Activity: Not on file  Stress: Not on file  Social Connections: Not on file   Family History: Family History  Problem Relation Age of Onset   Hypertension Mother    Diabetes Mother    Diabetes Father    Hypertension Father    Cancer Father    Diabetes Maternal Grandmother    Hypertension Maternal Grandmother    Heart disease Maternal Grandmother    Gout Maternal Grandfather    Diabetes Maternal Grandfather    Hypertension Maternal Grandfather    Allergies: Allergies  Allergen Reactions   Sulfa  Antibiotics Shortness Of Breath and Anaphylaxis   Sulfadiazine Rash   Medications: See med rec.  Review of Systems: No headache, visual changes, nausea, vomiting, diarrhea, constipation, dizziness, abdominal pain, skin rash, fevers, chills, night sweats, swollen lymph nodes, weight loss, chest pain, body aches, joint swelling, muscle aches, shortness of breath, mood changes, visual or auditory hallucinations.  Objective:    BP 122/88    Pulse 74    Ht 5\' 2"  (1.575 m)    Wt 179 lb (81.2 kg)    SpO2 98%    BMI 32.74 kg/m   General: Well Developed, well nourished, and in no acute distress.  Neuro: Alert and oriented x3, extra-ocular muscles intact, sensation grossly intact. Cranial nerves II through XII are intact, motor, sensory, and coordinative functions are all intact. HEENT: Normocephalic, atraumatic, pupils equal round reactive to light, neck supple, no masses, no lymphadenopathy, thyroid nonpalpable. Oropharynx, nasopharynx, external ear canals are unremarkable. Skin: Warm and dry, no rashes noted.  Cardiac: Regular rate and rhythm, no murmurs rubs or gallops.  Respiratory: Clear to auscultation bilaterally. Not using accessory muscles, speaking in full sentences.  Abdominal: Soft, nontender, nondistended, positive bowel sounds, no masses, no organomegaly.  Genitourinary: Normal-appearing external genitalia, no skin abnormalities or lesions present.  Cervix with mild redness, friability.  Swabs for Pap smear and HPV completed without difficulty. Musculoskeletal: Shoulder, elbow, wrist, hip, knee, ankle stable, and with full range of motion.  Impression and Recommendations:    Wellness examination The patient was counseled, risk factors were discussed, anticipatory guidance given. Reviewed recent records from prior Pap smear which was completed in 2016.  Also had HPV cotesting.  Pap smear and HPV results were normal, indicating 5-year follow-up recommended.  Discussed updated cervical  cancer screening would be due at this time, patient interested, completed today, will await results Reviewed recent labs from December 2022 which were unremarkable Does need to complete dental and vision screenings, last completed in 2020, recommend following up with providers related to this Typically she gets influenza vaccine through her work, has not done so the season, offered to complete today, patient defers and would prefer to have completed through her work Patient is up-to-date on tetanus Recommend continuing with healthy lifestyle modifications including dietary changes and exercise regimen  Anxiety Has had some increased symptoms over the past 2 to 3 weeks due to ongoing health concerns with her ex-husband who she reports has end-stage renal disease.  Due to his medical condition, he anticipates being placed into hospice in the near future.  As a result of things, she has been taking clonazepam more frequently recently.  Has had some trouble with sleep as well.  Other medications this time include citalopram and buspirone Discussed options with patient, will titrate dose of buspirone from 5 mg 3 times daily to 10 mg twice daily to try to help control symptoms  Plan for follow-up in about 3 to 4 weeks to monitor progress related to anxiety   ___________________________________________ Aamirah Salmi de [place], MD, ABFM, CAQSM Primary Care and Sports Medicine [facility]

## 2021-12-15 ENCOUNTER — Other Ambulatory Visit (HOSPITAL_BASED_OUTPATIENT_CLINIC_OR_DEPARTMENT_OTHER): Payer: Self-pay | Admitting: Family Medicine

## 2021-12-15 LAB — CYTOLOGY - PAP
Comment: NEGATIVE
Diagnosis: NEGATIVE
High risk HPV: NEGATIVE

## 2021-12-17 ENCOUNTER — Encounter (HOSPITAL_BASED_OUTPATIENT_CLINIC_OR_DEPARTMENT_OTHER): Payer: Self-pay | Admitting: Family Medicine

## 2021-12-17 ENCOUNTER — Ambulatory Visit (HOSPITAL_BASED_OUTPATIENT_CLINIC_OR_DEPARTMENT_OTHER): Payer: BC Managed Care – PPO

## 2021-12-17 ENCOUNTER — Telehealth (INDEPENDENT_AMBULATORY_CARE_PROVIDER_SITE_OTHER): Payer: BC Managed Care – PPO | Admitting: Family Medicine

## 2021-12-17 ENCOUNTER — Other Ambulatory Visit: Payer: Self-pay

## 2021-12-17 DIAGNOSIS — U071 COVID-19: Secondary | ICD-10-CM | POA: Diagnosis not present

## 2021-12-17 DIAGNOSIS — J069 Acute upper respiratory infection, unspecified: Secondary | ICD-10-CM | POA: Diagnosis not present

## 2021-12-17 DIAGNOSIS — Z20822 Contact with and (suspected) exposure to covid-19: Secondary | ICD-10-CM | POA: Diagnosis not present

## 2021-12-17 NOTE — Assessment & Plan Note (Signed)
About 2 days ago, patient began experiencing symptoms including sore throat, cough, body aches, fever.  She reports that T-max has been 103.7 F.  She has been using various over-the-counter measures to try and help control symptoms.  She has had some associated sinus congestion as well. She is not aware of any specific sick contacts recently, although she has been in the hospital to visit, but has not been around anyone who has been ill. On video visit, no obvious difficulty breathing, shortness of breath, able to speak in complete sentences without difficulty Discussed that symptoms seem most likely related to acute viral illness, particular concern is related to influenza or coronavirus given fever and body aches Patient did come to office to complete flu swab which was negative Advised on completing swab for coronavirus testing at local pharmacy, patient explained to do so this afternoon.  She will keep [place] informed of results once they are received In the meantime, recommend conservative, symptomatic management including pain and fever reducer as needed which can include Tylenol or ibuprofen Recommend ensuring adequate hydration, adequate rest Regards to sinus congestion, can utilize intranasal saline spray, intranasal steroid spray

## 2021-12-17 NOTE — Progress Notes (Addendum)
Virtual Visit via Telephone   I connected with  Kaitlyn Guzman  on 12/17/21 by telephone/telehealth and verified that I am speaking with the correct person using two identifiers.   I discussed the limitations, risks, security and privacy concerns of performing an evaluation and management service by telephone, including the higher likelihood of inaccurate diagnosis and treatment, and the availability of in person appointments.  We also discussed the likely need of an additional face to face encounter for complete and high quality delivery of care.  I also discussed with the patient that there may be a patient responsible charge related to this service. The patient expressed understanding and wishes to proceed.  Provider location is in medical facility. Patient location is at their home, different from provider location. People involved in care of the patient during this telehealth encounter were myself, my nurse/medical assistant, and my front office/scheduling team member.  Review of Systems: No fevers, chills, night sweats, weight loss, chest pain, or shortness of breath.   Objective Findings:    General: Speaking full sentences, no audible heavy breathing.  Sounds alert and appropriately interactive.    Independent interpretation of tests performed by another provider:   None.  Brief History, Exam, Impression, and Recommendations:    Viral URI About 2 days ago, patient began experiencing symptoms including sore throat, cough, body aches, fever.  She reports that T-max has been 103.7 F.  She has been using various over-the-counter measures to try and help control symptoms.  She has had some associated sinus congestion as well. She is not aware of any specific sick contacts recently, although she has been in the hospital to visit, but has not been around anyone who has been ill. On video visit, no obvious difficulty breathing, shortness of breath, able to speak in complete sentences  without difficulty Discussed that symptoms seem most likely related to acute viral illness, particular concern is related to influenza or coronavirus given fever and body aches Patient did come to office to complete flu swab which was negative Advised on completing swab for coronavirus testing at local pharmacy, patient explained to do so this afternoon.  She will keep [place] informed of results once they are received In the meantime, recommend conservative, symptomatic management including pain and fever reducer as needed which can include Tylenol or ibuprofen Recommend ensuring adequate hydration, adequate rest Regards to sinus congestion, can utilize intranasal saline spray, intranasal steroid spray  I discussed the above assessment and treatment plan with the patient. The patient was provided an opportunity to ask questions and all were answered. The patient agreed with the plan and demonstrated an understanding of the instructions.   The patient was advised to call back or seek an in-person evaluation if the symptoms worsen or if the condition fails to improve as anticipated.   I provided 15 minutes of face to face and non-face-to-face time during this encounter date, time was needed to gather information, review chart, records, communicate/coordinate with staff remotely, as well as complete documentation.   ___________________________________________ Myeesha Shane de [place], MD, ABFM, CAQSM Primary Care and Sports Medicine [facility] with patient, she had positive COVID test yesterday.  Discussed options, COVID-specific treatment.  Patient does have risk factor with elevated BMI within obesity range and thus would be candidate for treatment with Paxlovid.  Did discuss potential side effects related to medication as well as the fact that it can interact with numerous medications, most notably for current medications there can  be interaction with buspirone and clonazepam.   Discussed adjusting doses of these medications in order to limit this potential risk.  She voiced understanding and agreement.  Prescription sent to pharmacy on file.  Did caution on any progressive symptoms, particularly if she has any worsening shortness of breath and that she should present to emergency department for further evaluation if this does occur.

## 2021-12-18 MED ORDER — NIRMATRELVIR/RITONAVIR (PAXLOVID)TABLET: 3.0000 | ORAL_TABLET | Freq: Two times a day (BID) | ORAL | 0 refills | Status: AC

## 2021-12-18 NOTE — Addendum Note (Signed)
Addended by: DE [place], Nikole Swartzentruber J on: 12/18/2021 08:42 AM   Modules accepted: Orders

## 2021-12-24 DIAGNOSIS — R7303 Prediabetes: Secondary | ICD-10-CM | POA: Diagnosis not present

## 2022-01-02 ENCOUNTER — Other Ambulatory Visit (HOSPITAL_BASED_OUTPATIENT_CLINIC_OR_DEPARTMENT_OTHER): Payer: Self-pay | Admitting: Family Medicine

## 2022-01-04 ENCOUNTER — Other Ambulatory Visit (HOSPITAL_BASED_OUTPATIENT_CLINIC_OR_DEPARTMENT_OTHER): Payer: Self-pay | Admitting: Family Medicine

## 2022-01-06 NOTE — Telephone Encounter (Signed)
Duplicate requests

## 2022-01-09 ENCOUNTER — Encounter (HOSPITAL_BASED_OUTPATIENT_CLINIC_OR_DEPARTMENT_OTHER): Payer: Self-pay | Admitting: Family Medicine

## 2022-01-09 ENCOUNTER — Ambulatory Visit (HOSPITAL_BASED_OUTPATIENT_CLINIC_OR_DEPARTMENT_OTHER): Payer: BC Managed Care – PPO | Admitting: Family Medicine

## 2022-01-15 ENCOUNTER — Encounter (HOSPITAL_BASED_OUTPATIENT_CLINIC_OR_DEPARTMENT_OTHER): Payer: Self-pay | Admitting: Family Medicine

## 2022-01-15 ENCOUNTER — Other Ambulatory Visit: Payer: Self-pay

## 2022-01-15 ENCOUNTER — Ambulatory Visit (HOSPITAL_BASED_OUTPATIENT_CLINIC_OR_DEPARTMENT_OTHER): Payer: BC Managed Care – PPO | Admitting: Family Medicine

## 2022-01-15 VITALS — BP 122/82 | HR 79 | Ht 62.0 in | Wt 183.0 lb

## 2022-01-15 DIAGNOSIS — F419 Anxiety disorder, unspecified: Secondary | ICD-10-CM

## 2022-01-15 NOTE — Progress Notes (Signed)
° ° °  Procedures performed today:    None.  Independent interpretation of notes and tests performed by another provider:   None.  Brief History, Exam, Impression, and Recommendations:    BP 122/82    Pulse 79    Ht 5\' 2"  (1.575 m)    Wt 183 lb (83 kg)    SpO2 99%    BMI 33.47 kg/m   No problem-specific Assessment & Plan notes found for this encounter.    ___________________________________________ Gamble Enderle de [place], MD, ABFM, CAQSM Primary Care and [place]

## 2022-01-15 NOTE — Assessment & Plan Note (Signed)
Patient doing well overall today.  At last appointment she was having some increased symptoms and we increased the BuSpar to 10 mg twice daily and she reports that this has been helpful in controlling her symptoms.  Still have some increased worries which she feels occurs more when she is not as active, typically first thing in the morning or closer to bedtime.  She has completed counseling in the past, most recently was a few months ago, no strong interest in resuming at this time Last time that she was clonazepam was about 2 weeks ago, not requiring this frequently since last office visit and with medication changes Plan for follow-up in about 4 to 6 months or sooner as needed, can continue with current medication regimen

## 2022-01-15 NOTE — Patient Instructions (Signed)
  Medication Instructions:  Your physician recommends that you continue on your current medications as directed. Please refer to the Current Medication list given to you today. --If you need a refill on any your medications before your next appointment, please call your pharmacy first. If no refills are authorized on file call the office.-- Follow-Up: Your next appointment:   Your physician recommends that you schedule a follow-up appointment in: 4-6 MONTHS with Dr. de [place]  You will receive a text message or e-mail with a link to a survey about your care and experience with us today! We would greatly appreciate your feedback!   Thanks for letting us be apart of your health journey!!  Primary Care and Sports Medicine   Dr. Raymond de [place]   We encourage you to activate your patient portal called "MyChart".  Sign up information is provided on this After Visit Summary.  MyChart is used to connect with patients for Virtual Visits (Telemedicine).  Patients are able to view lab/test results, encounter notes, upcoming appointments, etc.  Non-urgent messages can be sent to your provider as well. To learn more about what you can do with MyChart, please visit --  [email or web address].    

## 2022-01-16 ENCOUNTER — Other Ambulatory Visit (HOSPITAL_BASED_OUTPATIENT_CLINIC_OR_DEPARTMENT_OTHER): Payer: Self-pay | Admitting: Family Medicine

## 2022-01-24 DIAGNOSIS — R7303 Prediabetes: Secondary | ICD-10-CM | POA: Diagnosis not present

## 2022-02-04 ENCOUNTER — Other Ambulatory Visit (HOSPITAL_BASED_OUTPATIENT_CLINIC_OR_DEPARTMENT_OTHER): Payer: Self-pay | Admitting: Family Medicine

## 2022-02-04 DIAGNOSIS — Z0000 Encounter for general adult medical examination without abnormal findings: Secondary | ICD-10-CM

## 2022-02-21 DIAGNOSIS — R7303 Prediabetes: Secondary | ICD-10-CM | POA: Diagnosis not present

## 2022-03-24 DIAGNOSIS — R7303 Prediabetes: Secondary | ICD-10-CM | POA: Diagnosis not present

## 2022-04-23 DIAGNOSIS — R7303 Prediabetes: Secondary | ICD-10-CM | POA: Diagnosis not present

## 2022-05-11 ENCOUNTER — Other Ambulatory Visit (HOSPITAL_BASED_OUTPATIENT_CLINIC_OR_DEPARTMENT_OTHER): Payer: Self-pay | Admitting: Family Medicine

## 2022-05-24 DIAGNOSIS — R7303 Prediabetes: Secondary | ICD-10-CM | POA: Diagnosis not present

## 2022-06-23 DIAGNOSIS — R7303 Prediabetes: Secondary | ICD-10-CM | POA: Diagnosis not present

## 2022-07-14 ENCOUNTER — Other Ambulatory Visit (HOSPITAL_BASED_OUTPATIENT_CLINIC_OR_DEPARTMENT_OTHER): Payer: Self-pay | Admitting: Family Medicine

## 2022-07-15 ENCOUNTER — Ambulatory Visit (HOSPITAL_BASED_OUTPATIENT_CLINIC_OR_DEPARTMENT_OTHER): Payer: BC Managed Care – PPO | Admitting: Family Medicine

## 2022-07-16 ENCOUNTER — Encounter (HOSPITAL_BASED_OUTPATIENT_CLINIC_OR_DEPARTMENT_OTHER): Payer: Self-pay | Admitting: Family Medicine

## 2022-07-16 ENCOUNTER — Ambulatory Visit (HOSPITAL_BASED_OUTPATIENT_CLINIC_OR_DEPARTMENT_OTHER): Payer: BC Managed Care – PPO | Admitting: Family Medicine

## 2022-07-16 DIAGNOSIS — Z0000 Encounter for general adult medical examination without abnormal findings: Secondary | ICD-10-CM

## 2022-07-16 DIAGNOSIS — F419 Anxiety disorder, unspecified: Secondary | ICD-10-CM | POA: Diagnosis not present

## 2022-07-16 NOTE — Patient Instructions (Signed)
  Medication Instructions:  Your physician recommends that you continue on your current medications as directed. Please refer to the Current Medication list given to you today. --If you need a refill on any your medications before your next appointment, please call your pharmacy first. If no refills are authorized on file call the office.-- Lab Work: Your physician has recommended that you have lab work today: No If you have labs (blood work) drawn today and your tests are completely normal, you will receive your results via MyChart message OR a phone call from our staff.  Please ensure you check your voicemail in the event that you authorized detailed messages to be left on a delegated number. If you have any lab test that is abnormal or we need to change your treatment, we will call you to review the results.  Referrals/Procedures/Imaging: No  Follow-Up: Your next appointment:   Your physician recommends that you schedule a follow-up appointment in: 6 months cpe with Dr. de [place].  You will receive a text message or e-mail with a link to a survey about your care and experience with us today! We would greatly appreciate your feedback!   Thanks for letting us be apart of your health journey!!  Primary Care and Sports Medicine   Dr. Raymond de [place]   We encourage you to activate your patient portal called "MyChart".  Sign up information is provided on this After Visit Summary.  MyChart is used to connect with patients for Virtual Visits (Telemedicine).  Patients are able to view lab/test results, encounter notes, upcoming appointments, etc.  Non-urgent messages can be sent to your provider as well. To learn more about what you can do with MyChart, please visit --  [email or web address].    

## 2022-07-16 NOTE — Assessment & Plan Note (Signed)
Patient is doing quite well at the moment, she continues with regular use of citalopram as well as BuSpar.  Reports that symptoms of anxiety are well controlled currently, rarely needing to use clonazepam.  She is not sure if she needs any refills today, will check her medication supply at home and request refills if needed. She does have questions regarding potentially coming off of medication in the future as she would prefer to not have to remain on medications over the long-term.  We did discuss today the possibility of weaning medications with future and monitoring progress with this.  Discussed utilizing alternative means to assist with controlling symptoms during that weaning process such as counseling/therapy, home techniques and mindfulness activities including yoga, exercise, breathing exercises.  She has engaged with counseling in the past, not currently following with anyone For now we will continue with current medication regimen with consideration for weaning in the future as desired by patient

## 2022-07-16 NOTE — Progress Notes (Signed)
    Procedures performed today:    None.  Independent interpretation of notes and tests performed by another provider:   None.  Brief History, Exam, Impression, and Recommendations:    BP (!) 152/94   Pulse 70   Ht 5\' 2"  (1.575 m)   Wt 190 lb 1.6 oz (86.2 kg)   SpO2 100%   BMI 34.77 kg/m   Anxiety Patient is doing quite well at the moment, she continues with regular use of citalopram as well as BuSpar.  Reports that symptoms of anxiety are well controlled currently, rarely needing to use clonazepam.  She is not sure if she needs any refills today, will check her medication supply at home and request refills if needed. She does have questions regarding potentially coming off of medication in the future as she would prefer to not have to remain on medications over the long-term.  We did discuss today the possibility of weaning medications with future and monitoring progress with this.  Discussed utilizing alternative means to assist with controlling symptoms during that weaning process such as counseling/therapy, home techniques and mindfulness activities including yoga, exercise, breathing exercises.  She has engaged with counseling in the past, not currently following with anyone For now we will continue with current medication regimen with consideration for weaning in the future as desired by patient  Return in about 6 months (around 01/16/2023) for CPE with [facility] a few days prior.   ___________________________________________ Aashi Derrington de 01/18/2023, MD, ABFM, CAQSM Primary Care and Sports Medicine [facility]

## 2022-07-20 ENCOUNTER — Other Ambulatory Visit (HOSPITAL_BASED_OUTPATIENT_CLINIC_OR_DEPARTMENT_OTHER): Payer: Self-pay | Admitting: Family Medicine

## 2022-07-24 DIAGNOSIS — R7303 Prediabetes: Secondary | ICD-10-CM | POA: Diagnosis not present

## 2022-07-26 ENCOUNTER — Other Ambulatory Visit (HOSPITAL_BASED_OUTPATIENT_CLINIC_OR_DEPARTMENT_OTHER): Payer: Self-pay | Admitting: Family Medicine

## 2022-07-26 DIAGNOSIS — Z0000 Encounter for general adult medical examination without abnormal findings: Secondary | ICD-10-CM

## 2022-08-24 DIAGNOSIS — R7303 Prediabetes: Secondary | ICD-10-CM | POA: Diagnosis not present

## 2022-09-23 DIAGNOSIS — R7303 Prediabetes: Secondary | ICD-10-CM | POA: Diagnosis not present

## 2022-11-23 DIAGNOSIS — R7303 Prediabetes: Secondary | ICD-10-CM | POA: Diagnosis not present

## 2022-12-04 ENCOUNTER — Encounter (HOSPITAL_BASED_OUTPATIENT_CLINIC_OR_DEPARTMENT_OTHER): Payer: Self-pay | Admitting: Family Medicine

## 2022-12-04 ENCOUNTER — Ambulatory Visit (HOSPITAL_BASED_OUTPATIENT_CLINIC_OR_DEPARTMENT_OTHER): Payer: No Typology Code available for payment source | Admitting: Family Medicine

## 2022-12-04 DIAGNOSIS — J329 Chronic sinusitis, unspecified: Secondary | ICD-10-CM | POA: Insufficient documentation

## 2022-12-04 DIAGNOSIS — J019 Acute sinusitis, unspecified: Secondary | ICD-10-CM

## 2022-12-04 MED ORDER — AMOXICILLIN 875 MG PO TABS: 875.0000 mg | ORAL_TABLET | Freq: Two times a day (BID) | ORAL | 0 refills | Status: AC

## 2022-12-04 MED ORDER — FLUCONAZOLE 150 MG PO TABS: 150.0000 mg | ORAL_TABLET | Freq: Once | ORAL | 0 refills | Status: AC

## 2022-12-04 NOTE — Assessment & Plan Note (Signed)
Patient reports that beginning about 2 to 3 weeks ago, she began experiencing cough, sinus congestion, rhinorrhea.  She initially treated symptoms with over-the-counter therapies.  Symptoms have persisted, primarily related to sinus congestion, pressure, headaches, fatigue.  She has had intermittent coughing, this has generally improved since initial symptom onset.  She has not had any recent fevers.  Cough initially was productive, now is nonproductive.  She has not had any notable shortness of breath or trouble breathing. During encounter, patient able to speak in complete sentences, no audible dyspnea or trouble breathing. Given duration of symptoms, it is certainly possible that initial viral infection has transition to superimposed bacterial infection.  Discussed this possibility with patient.  Given this, we will proceed with initiation of antibiotic therapy.  Prescription for amoxicillin sent to pharmacy on file.  Discussed potential risks and side effects related to medication.  Reviewed conservative measures to utilize to help and control of symptoms including honey, OTC medications Discussed expected gradual improvement in symptoms over the coming days with use of antibiotics.  Discussed ER precautions should symptoms worsen or should patient develop any fevers, shortness of breath

## 2022-12-04 NOTE — Progress Notes (Signed)
   Virtual Visit via Telephone   I connected with  Kaitlyn Guzman  on 12/04/22 by telephone/telehealth and verified that I am speaking with the correct person using two identifiers.   I discussed the limitations, risks, security and privacy concerns of performing an evaluation and management service by telephone, including the higher likelihood of inaccurate diagnosis and treatment, and the availability of in person appointments.  We also discussed the likely need of an additional face to face encounter for complete and high quality delivery of care.  I also discussed with the patient that there may be a patient responsible charge related to this service. The patient expressed understanding and wishes to proceed.  Provider location is in medical facility. Patient location is at their home, different from provider location. People involved in care of the patient during this telehealth encounter were myself, my nurse/medical assistant, and my front office/scheduling team member.  Review of Systems: No fevers, chills, night sweats, weight loss, chest pain, or shortness of breath.   Objective Findings:    General: Speaking full sentences, no audible heavy breathing.  Sounds alert and appropriately interactive.    Independent interpretation of tests performed by another provider:   None.  Brief History, Exam, Impression, and Recommendations:    Sinusitis Patient reports that beginning about 2 to 3 weeks ago, she began experiencing cough, sinus congestion, rhinorrhea.  She initially treated symptoms with over-the-counter therapies.  Symptoms have persisted, primarily related to sinus congestion, pressure, headaches, fatigue.  She has had intermittent coughing, this has generally improved since initial symptom onset.  She has not had any recent fevers.  Cough initially was productive, now is nonproductive.  She has not had any notable shortness of breath or trouble breathing. During encounter,  patient able to speak in complete sentences, no audible dyspnea or trouble breathing. Given duration of symptoms, it is certainly possible that initial viral infection has transition to superimposed bacterial infection.  Discussed this possibility with patient.  Given this, we will proceed with initiation of antibiotic therapy.  Prescription for amoxicillin sent to pharmacy on file.  Discussed potential risks and side effects related to medication.  Reviewed conservative measures to utilize to help and control of symptoms including honey, OTC medications Discussed expected gradual improvement in symptoms over the coming days with use of antibiotics.  Discussed ER precautions should symptoms worsen or should patient develop any fevers, shortness of breath  I discussed the above assessment and treatment plan with the patient. The patient was provided an opportunity to ask questions and all were answered. The patient agreed with the plan and demonstrated an understanding of the instructions.  The patient was advised to call back or seek an in-person evaluation if the symptoms worsen or if the condition fails to improve as anticipated.  I provided 13 minutes of face to face and non-face-to-face time during this encounter date, time was needed to gather information, review chart, records, communicate/coordinate with staff remotely, as well as complete documentation.   ___________________________________________ Taleyah Hillman de [place], MD, ABFM, CAQSM Primary Care and [place]

## 2023-01-01 ENCOUNTER — Encounter (HOSPITAL_BASED_OUTPATIENT_CLINIC_OR_DEPARTMENT_OTHER): Payer: Self-pay

## 2023-01-07 ENCOUNTER — Ambulatory Visit (HOSPITAL_BASED_OUTPATIENT_CLINIC_OR_DEPARTMENT_OTHER): Payer: BC Managed Care – PPO

## 2023-01-15 ENCOUNTER — Encounter (HOSPITAL_BASED_OUTPATIENT_CLINIC_OR_DEPARTMENT_OTHER): Payer: No Typology Code available for payment source | Admitting: Family Medicine

## 2023-01-18 ENCOUNTER — Other Ambulatory Visit (HOSPITAL_BASED_OUTPATIENT_CLINIC_OR_DEPARTMENT_OTHER): Payer: Self-pay | Admitting: Family Medicine

## 2023-01-28 ENCOUNTER — Other Ambulatory Visit (HOSPITAL_BASED_OUTPATIENT_CLINIC_OR_DEPARTMENT_OTHER): Payer: Self-pay | Admitting: Family Medicine

## 2023-01-28 DIAGNOSIS — Z0000 Encounter for general adult medical examination without abnormal findings: Secondary | ICD-10-CM

## 2023-02-10 ENCOUNTER — Encounter (HOSPITAL_BASED_OUTPATIENT_CLINIC_OR_DEPARTMENT_OTHER): Payer: Self-pay

## 2023-03-17 LAB — HM MAMMOGRAPHY

## 2023-03-30 ENCOUNTER — Encounter (HOSPITAL_BASED_OUTPATIENT_CLINIC_OR_DEPARTMENT_OTHER): Payer: Self-pay | Admitting: Family Medicine

## 2023-04-19 ENCOUNTER — Ambulatory Visit (HOSPITAL_BASED_OUTPATIENT_CLINIC_OR_DEPARTMENT_OTHER): Payer: No Typology Code available for payment source | Admitting: Family Medicine

## 2023-06-17 ENCOUNTER — Encounter (HOSPITAL_BASED_OUTPATIENT_CLINIC_OR_DEPARTMENT_OTHER): Payer: Self-pay | Admitting: *Deleted

## 2023-06-17 ENCOUNTER — Telehealth (HOSPITAL_BASED_OUTPATIENT_CLINIC_OR_DEPARTMENT_OTHER): Payer: Self-pay | Admitting: *Deleted

## 2023-06-17 NOTE — Telephone Encounter (Signed)
 LVM to offer colon cancer screening. Mychart message sent

## 2023-06-22 ENCOUNTER — Ambulatory Visit: Payer: No Typology Code available for payment source | Admitting: Plastic Surgery

## 2023-06-22 ENCOUNTER — Encounter: Payer: Self-pay | Admitting: Plastic Surgery

## 2023-06-22 VITALS — BP 126/86 | HR 76 | Ht 62.0 in | Wt 160.0 lb

## 2023-06-22 DIAGNOSIS — L910 Hypertrophic scar: Secondary | ICD-10-CM | POA: Diagnosis not present

## 2023-06-22 NOTE — Progress Notes (Signed)
Referring Provider de [place], Buren Kos, MD [address] [place],  [place] [address]   CC:  Chief Complaint  Patient presents with   Consult      Kaitlyn Guzman is an 47 y.o. female.  HPI: Kaitlyn Guzman is a 47 year old female who presents today with a small keloid on the right ear.  Patient states that it is quite painful and itches.  She would like to have it removed.  Allergies  Allergen Reactions   Sulfa Antibiotics Shortness Of Breath and Anaphylaxis   Sulfadiazine Rash    Outpatient Encounter Medications as of 06/22/2023  Medication Sig   busPIRone (BUSPAR) 10 MG tablet TAKE 1 TABLET BY MOUTH TWICE A DAY   citalopram (CELEXA) 20 MG tablet TAKE 1 TABLET BY MOUTH EVERY DAY   clonazePAM (KLONOPIN) 0.5 MG tablet TAKE 1 TABLET BY MOUTH THREE TIMES A DAY AS NEEDED FOR ANXIETY   No facility-administered encounter medications on file as of 06/22/2023.     Past Medical History:  Diagnosis Date   Anxiety    Depression     Past Surgical History:  Procedure Laterality Date   BREAST REDUCTION SURGERY Bilateral    CHOLECYSTECTOMY     LIPOSUCTION     08/19/2021   TONSILLECTOMY      Family History  Problem Relation Age of Onset   Hypertension Mother    Diabetes Mother    Diabetes Father    Hypertension Father    Cancer Father    Diabetes Maternal Grandmother    Hypertension Maternal Grandmother    Heart disease Maternal Grandmother    Gout Maternal Grandfather    Diabetes Maternal Grandfather    Hypertension Maternal Grandfather     Social History   Social History Narrative   Not on file     Review of Systems General: Denies fevers, chills, weight loss CV: Denies chest pain, shortness of breath, palpitations Skin: 1 cm painful keloid on the right ear  Physical Exam    06/22/2023   12:59 PM 07/16/2022    2:16 PM 01/15/2022    8:19 AM  Vitals with BMI  Height 5\' 2"  5\' 2"  5\' 2"   Weight 160 lbs 190 lbs 2 oz 183 lbs  BMI 29.26 34.76 33.46  Systolic  126 152 122  Diastolic 86 94 82  Pulse 76 70 79    General:  No acute distress,  Alert and oriented, Non-Toxic, Normal speech and affect Integument: 1 cm keloid on the right ear Mammogram: April 2024 BI-RADS 1 Assessment/Plan Keloid: We discussed management of keloids including excision and the need for adjuvant therapy.  We did discuss radiation therapy but she is not interested in this and given the size of the keloid it would probably not be appropriate.  We will plan to inject the keloid with steroids at the time of the excision.  Patient requested an injection of steroids today to help with the itching until her surgery is scheduled.  Will inject the keloid with a 50-50 mixture of Kenalog 40 and 1% plain lidocaine.  Procedure Note  Preoperative Dx: Keloid right ear  Postoperative Dx: Same  Procedure: Injection of keloid with steroids    Indication for Procedure: Treat pain and itching  Description of Procedure: Risks and complications were explained to the patient including the risk of skin thinning and hypopigmentation.  Consent was confirmed and the patient understands the risks and benefits.  The potential complications and alternatives were explained and the patient consents.  The patient expressed understanding the option of not having the procedure and the risks of a scar.  Time out was called and all information was confirmed to be correct.    The keloid was prepped with alcohol then injected with 0.5 mL of a 50-50 mixture of Kenalog 40 and 1% plain lidocaine.  The patient was given instructions on how to care for the area and a follow up appointment.  Kaitlyn Guzman tolerated the procedure well and there were no complications.   [name] 06/22/2023, 1:18 PM

## 2023-07-01 ENCOUNTER — Encounter: Payer: Self-pay | Admitting: Plastic Surgery

## 2023-07-01 ENCOUNTER — Ambulatory Visit: Payer: No Typology Code available for payment source | Admitting: Plastic Surgery

## 2023-07-01 VITALS — BP 129/79 | HR 75

## 2023-07-01 DIAGNOSIS — L910 Hypertrophic scar: Secondary | ICD-10-CM

## 2023-07-01 NOTE — Progress Notes (Signed)
Procedure Note  Preoperative Dx: Right ear keloid  Postoperative Dx: Same  Procedure: Excision of right ear keloid  Anesthesia: Lidocaine 1% with 1:100,000 epinephrine and 0.25% Sensorcaine   Indication for Procedure: Removal due to pain and itching  Description of Procedure: Risks and complications were explained to the patient including the possibility of recurrence.  Consent was confirmed and the patient understands the risks and benefits.  The potential complications and alternatives were explained and the patient consents.  The patient expressed understanding the option of not having the procedure and the risks of a scar.  Time out was called and all information was confirmed to be correct.    The area was prepped and drapped.  Local anesthetic was injected in the subcutaneous tissues.  After waiting for the local to take affect an elliptical incision was made at the base of the keloid to preserve some of the skin the keloid was dissected away from the skin and down to the level of the cartilage.  The remaining skin was trimmed to allow an aesthetically pleasing closure after obtaining hemostasis, the surgical wound was closed with interrupted 5-0 Prolene sutures.  The surgical wound measured approximately 1 cm.  A dressing was applied.  The patient was given instructions on how to care for the area and a follow up appointment.  Kaitlyn Guzman tolerated the procedure well and there were no complications. The specimen was sent to pathology.

## 2023-07-06 ENCOUNTER — Ambulatory Visit (INDEPENDENT_AMBULATORY_CARE_PROVIDER_SITE_OTHER): Payer: No Typology Code available for payment source | Admitting: Physician Assistant

## 2023-07-06 VITALS — BP 132/90 | HR 76

## 2023-07-06 DIAGNOSIS — L910 Hypertrophic scar: Secondary | ICD-10-CM

## 2023-07-06 NOTE — Progress Notes (Signed)
Patient is a pleasant 47 year old female s/p right ear keloid excision performed 07/01/2023 by Dr. Ladona Ridgel who returns to clinic for postprocedural follow-up.    Reviewed procedural note and the excision site was closed with interrupted 5-0 Prolene sutures.  Specimen was obtained and sent to pathology, but remains active and still in process.  Today, the patient is doing well.  She states that she is bothered by a persistent bump at the site of her excision site.  She noticed it after removing her bandage and feels as though the keloid has not been excised in its entirety.  Otherwise, no specific complaints.  On exam, scattered Prolene interrupted sutures are removed in their entirety without complication or difficulty.  Towards the most inferior aspect of the excision repair site, unclear if additional suture versus scabbing remains.  Recommending thin film of Vaseline over the excision site and return in 10 days for reevaluation as well as consideration of Kenalog injection.  Will also discuss pathology at that time which I suspect will have resulted.

## 2023-07-20 ENCOUNTER — Ambulatory Visit: Payer: No Typology Code available for payment source | Admitting: Physician Assistant

## 2023-07-20 ENCOUNTER — Encounter: Payer: Self-pay | Admitting: Physician Assistant

## 2023-07-20 VITALS — BP 143/90 | HR 72 | Ht 62.0 in | Wt 160.0 lb

## 2023-07-20 DIAGNOSIS — L910 Hypertrophic scar: Secondary | ICD-10-CM

## 2023-07-20 NOTE — Progress Notes (Signed)
Patient is a pleasant 47 year old female s/p right ear keloid excision performed 07/01/2023 by Dr. Ladona Ridgel who returns to clinic for postprocedural follow-up.   She was seen most recently here in clinic on 07/06/2023, shortly after the excision.  At that time, Prolene interrupted sutures were removed in their entirety without complication or difficulty.  Towards the most inferior aspect of the excision repair site, unclear if additional suture remained versus simple scabbing.  Recommended thin film of Vaseline over the excision site and return in 10 days for reevaluation as well as consideration of Kenalog injection.  Pathology from specimen sent at time of excision was consistent with keloid.  Today, patient is doing well from postoperative standpoint.  She reports that shortly after last encounter, she noticed that the blue spot on the inferior aspect of her excision site washed away and was not reflective of residual suture.  However, she is slightly bothered with the persistent presence of the keloid.  She was hoping that it would have been fully excised and is wondering if perhaps further revision can occur.  Otherwise, no complaints.  On exam, residual keloid noted on pinna of right ear.  Patient admits that it is certainly improved compared to her preprocedural state, but is hopeful that it can go away completely.  Her exam is benign, no redness, dehiscence, wounds, evidence of retained suture, or other abnormal findings.  Discussed steroid injection today to help mitigate risk of recurrence or worsening keloid.  She was agreeable.  Procedure Note   Preoperative Dx: keloid excision site right ear   Postoperative Dx: Same   Procedure: kenalog injection to right ear keloid excision site   Anesthesia: Lidocaine 1% with 1:100,000 epinephrine     Description of Procedure: Risks and complications were explained to the patient.  Consent was confirmed and the patient understands the risks and  benefits.  The potential complications and alternatives were explained and the patient consents.  The patient expressed understanding the option of not having the procedure and the risks of a scar.  Time out was called and all information was confirmed to be correct.   The area was prepped and drapped.  Lidocaine 1% with epinepherine 0.1 cc was mixed with 0.1 cc of kenalog 40/1.  The left ear lobe keloid was injected with the entire 0.2 cc of the mixture.  A dressing was applied.  The patient was given instructions on how to care for the area and a follow up appointment.  Kihanna tolerated the procedure well and there were no complications.  Follow-up in 6 weeks with Dr. Ladona Ridgel for reevaluation.

## 2023-08-03 ENCOUNTER — Encounter: Payer: Self-pay | Admitting: Family Medicine

## 2023-08-11 ENCOUNTER — Other Ambulatory Visit (HOSPITAL_BASED_OUTPATIENT_CLINIC_OR_DEPARTMENT_OTHER): Payer: Self-pay | Admitting: Family Medicine

## 2023-08-11 DIAGNOSIS — Z0000 Encounter for general adult medical examination without abnormal findings: Secondary | ICD-10-CM

## 2023-08-30 ENCOUNTER — Telehealth (HOSPITAL_BASED_OUTPATIENT_CLINIC_OR_DEPARTMENT_OTHER): Payer: Self-pay | Admitting: Family Medicine

## 2023-08-30 ENCOUNTER — Other Ambulatory Visit (HOSPITAL_BASED_OUTPATIENT_CLINIC_OR_DEPARTMENT_OTHER): Payer: Self-pay | Admitting: Family Medicine

## 2023-08-30 DIAGNOSIS — Z0000 Encounter for general adult medical examination without abnormal findings: Secondary | ICD-10-CM

## 2023-08-30 NOTE — Telephone Encounter (Signed)
Pt is calling needing a refill on celexa. Not sure why the prescription stated Dorena Bodo. This was sent to you in a rx refill as well.

## 2023-08-30 NOTE — Telephone Encounter (Signed)
Pt called to speak with Nurse--advised in clinic--she asked for a call back

## 2023-09-01 NOTE — Telephone Encounter (Signed)
Pt scheduled for 10/8 for physical would like to know if you would like to order blood work before visit please advise

## 2023-09-02 NOTE — Telephone Encounter (Signed)
Pt notified labs were ordered will come by and have drawn

## 2023-09-06 ENCOUNTER — Ambulatory Visit: Payer: No Typology Code available for payment source | Admitting: Plastic Surgery

## 2023-09-07 ENCOUNTER — Ambulatory Visit (HOSPITAL_BASED_OUTPATIENT_CLINIC_OR_DEPARTMENT_OTHER): Payer: No Typology Code available for payment source | Admitting: Family Medicine

## 2023-09-07 ENCOUNTER — Encounter (HOSPITAL_BASED_OUTPATIENT_CLINIC_OR_DEPARTMENT_OTHER): Payer: Self-pay | Admitting: Family Medicine

## 2023-09-07 VITALS — BP 130/94 | HR 70 | Ht 62.0 in | Wt 159.0 lb

## 2023-09-07 DIAGNOSIS — Z0000 Encounter for general adult medical examination without abnormal findings: Secondary | ICD-10-CM | POA: Diagnosis not present

## 2023-09-07 DIAGNOSIS — Z1211 Encounter for screening for malignant neoplasm of colon: Secondary | ICD-10-CM | POA: Diagnosis not present

## 2023-09-07 DIAGNOSIS — Z23 Encounter for immunization: Secondary | ICD-10-CM

## 2023-09-07 NOTE — Patient Instructions (Signed)
  Medication Instructions:  Your physician recommends that you continue on your current medications as directed. Please refer to the Current Medication list given to you today. --If you need a refill on any your medications before your next appointment, please call your pharmacy first. If no refills are authorized on file call the office.-- Lab Work: Your physician has recommended that you have lab work today: no If you have labs (blood work) drawn today and your tests are completely normal, you will receive your results via MyChart message OR a phone call from our staff.  Please ensure you check your voicemail in the event that you authorized detailed messages to be left on a delegated number. If you have any lab test that is abnormal or we need to change your treatment, we will call you to review the results.  Referrals/Procedures/Imaging: no  Follow-Up: Your next appointment:   Your physician recommends that you schedule a follow-up appointment in: 1 year with Dr. de [place]  You will receive a text message or e-mail with a link to a survey about your care and experience with [place] today! We would greatly appreciate your feedback!   Thanks for letting us be apart of your health journey!!  Primary Care and Sports Medicine   Dr. Ceasar Mons [place]   We encourage you to activate your patient portal called "MyChart".  Sign up information is provided on this After Visit Summary.  MyChart is used to connect with patients for Virtual Visits (Telemedicine).  Patients are able to view lab/test results, encounter notes, upcoming appointments, etc.  Non-urgent messages can be sent to your provider as well. To learn more about what you can do with MyChart, please visit --  [email or web address].

## 2023-09-07 NOTE — Progress Notes (Signed)
Subjective:    CC: Annual Physical Exam  HPI: Kaitlyn Guzman is a 47 y.o. presenting for annual physical  I reviewed the past medical history, family history, social history, surgical history, and allergies today and no changes were needed.  Please see the problem list section below in epic for further details.  Past Medical History: Past Medical History:  Diagnosis Date   Allergy    Sulfa meds   Anxiety    Depression    Past Surgical History: Past Surgical History:  Procedure Laterality Date   BREAST REDUCTION SURGERY Bilateral    BREAST SURGERY     Breast reduction   CHOLECYSTECTOMY     LIPOSUCTION     08/19/2021   TONSILLECTOMY     Social History: Social History   Socioeconomic History   Marital status: Single    Spouse name: Not on file   Number of children: Not on file   Years of education: Not on file   Highest education level: Bachelor's degree (e.g., BA, AB, BS)  Occupational History   Not on file  Tobacco Use   Smoking status: Never    Passive exposure: Past   Smokeless tobacco: Never  Vaping Use   Vaping status: Never Used  Substance and Sexual Activity   Alcohol use: Yes    Alcohol/week: 7.0 standard drinks of alcohol    Types: 7 Glasses of wine per week   Drug use: Never   Sexual activity: Yes    Birth control/protection: Abstinence  Other Topics Concern   Not on file  Social History Narrative   Not on file   Social Determinants of Health   Financial Resource Strain: Low Risk  (09/06/2023)   Overall Financial Resource Strain (CARDIA)    Difficulty of Paying Living Expenses: Not hard at all  Food Insecurity: No Food Insecurity (09/06/2023)   Hunger Vital Sign    Worried About Running Out of Food in the Last Year: Never true    Ran Out of Food in the Last Year: Never true  Transportation Needs: No Transportation Needs (09/06/2023)   PRAPARE - Administrator, Civil Service (Medical): No    Lack of Transportation (Non-Medical):  No  Physical Activity: Insufficiently Active (09/06/2023)   Exercise Vital Sign    Days of Exercise per Week: 4 days    Minutes of Exercise per Session: 30 min  Stress: Stress Concern Present (09/06/2023)   [employer] of Occupational Health - Occupational Stress Questionnaire    Feeling of Stress : To some extent  Social Connections: Socially Integrated (09/06/2023)   Social Connection and Isolation Panel [NHANES]    Frequency of Communication with Friends and Family: More than three times a week    Frequency of Social Gatherings with Friends and Family: Twice a week    Attends Religious Services: 1 to 4 times per year    Active Member of [employer] or Organizations: Yes    Attends Engineer, structural: More than 4 times per year    Marital Status: Living with partner   Family History: Family History  Problem Relation Age of Onset   Hypertension Mother    Diabetes Mother    Anxiety disorder Mother    Arthritis Mother    Depression Mother    Obesity Mother    Diabetes Father    Hypertension Father    Cancer Father    Diabetes Maternal Grandmother    Hypertension Maternal Grandmother    Heart disease Maternal  Grandmother    Kidney disease Maternal Grandmother    Obesity Maternal Grandmother    Gout Maternal Grandfather    Diabetes Maternal Grandfather    Hypertension Maternal Grandfather    Allergies: Allergies  Allergen Reactions   Sulfa Antibiotics Shortness Of Breath and Anaphylaxis   Sulfadiazine Rash   Medications: See med rec.  Review of Systems: No headache, visual changes, nausea, vomiting, diarrhea, constipation, dizziness, abdominal pain, skin rash, fevers, chills, night sweats, swollen lymph nodes, weight loss, chest pain, body aches, joint swelling, muscle aches, shortness of breath, mood changes, visual or auditory hallucinations.  Objective:    BP (!) 130/94 (BP Location: Left Arm, Patient Position: Sitting, Cuff Size: Normal)   Pulse 70   Ht 5'  2" (1.575 m)   Wt 159 lb (72.1 kg)   SpO2 99%   BMI 29.08 kg/m   General: Well Developed, well nourished, and in no acute distress. Neuro: Alert and oriented x3, extra-ocular muscles intact, sensation grossly intact. Cranial nerves II through XII are intact, motor, sensory, and coordinative functions are all intact. HEENT: Normocephalic, atraumatic, pupils equal round reactive to light, neck supple, no masses, no lymphadenopathy, thyroid nonpalpable. Oropharynx, nasopharynx, external ear canals are unremarkable. Skin: Warm and dry, no rashes noted.  Cardiac: Regular rate and rhythm, no murmurs rubs or gallops. Respiratory: Clear to auscultation bilaterally. Not using accessory muscles, speaking in full sentences. Abdominal: Soft, nontender, nondistended, positive bowel sounds, no masses, no organomegaly. Musculoskeletal: Shoulder, elbow, wrist, hip, knee, ankle stable, and with full range of motion.  Impression and Recommendations:    Encounter for immunization -     Flu vaccine trivalent PF, 6mos and older(Flulaval,Afluria,Fluarix,Fluzone)  Screening for colon cancer -     Ambulatory referral to Gastroenterology  Wellness examination Assessment & Plan: Routine HCM labs ordered. HCM reviewed/discussed. Anticipatory guidance regarding healthy weight, lifestyle and choices given. Recommend healthy diet.  Recommend approximately 150 minutes/week of moderate intensity exercise Recommend regular dental and vision exams Always use seatbelt/lap and shoulder restraints Recommend using smoke alarms and checking batteries at least twice a year Recommend using sunscreen when outside Discussed colon cancer screening recommendations, options.  Referral to GI today Discussed tetanus immunization recommendations, patient is UTD   Return in about 1 year (around 09/06/2024) for CPE.   ___________________________________________ Sollie Vultaggio de [place], MD, ABFM, CAQSM Primary Care and Sports  [facility]

## 2023-09-07 NOTE — Assessment & Plan Note (Signed)
Routine HCM labs ordered. HCM reviewed/discussed. Anticipatory guidance regarding healthy weight, lifestyle and choices given. Recommend healthy diet.  Recommend approximately 150 minutes/week of moderate intensity exercise Recommend regular dental and vision exams Always use seatbelt/lap and shoulder restraints Recommend using smoke alarms and checking batteries at least twice a year Recommend using sunscreen when outside Discussed colon cancer screening recommendations, options.  Referral to GI today Discussed tetanus immunization recommendations, patient is UTD

## 2023-09-08 ENCOUNTER — Other Ambulatory Visit (HOSPITAL_BASED_OUTPATIENT_CLINIC_OR_DEPARTMENT_OTHER): Payer: Self-pay | Admitting: *Deleted

## 2023-09-08 DIAGNOSIS — Z0000 Encounter for general adult medical examination without abnormal findings: Secondary | ICD-10-CM

## 2023-09-09 LAB — HEMOGLOBIN A1C
Est. average glucose Bld gHb Est-mCnc: 94 mg/dL
Hgb A1c MFr Bld: 4.9 % (ref 4.8–5.6)

## 2023-09-09 LAB — COMPREHENSIVE METABOLIC PANEL
ALT: 8 [IU]/L (ref 0–32)
AST: 39 [IU]/L (ref 0–40)
Albumin: 4.4 g/dL (ref 3.9–4.9)
Alkaline Phosphatase: 52 [IU]/L (ref 44–121)
BUN/Creatinine Ratio: 10 (ref 9–23)
BUN: 8 mg/dL (ref 6–24)
Bilirubin Total: 0.7 mg/dL (ref 0.0–1.2)
CO2: 20 mmol/L (ref 20–29)
Calcium: 9.8 mg/dL (ref 8.7–10.2)
Chloride: 102 mmol/L (ref 96–106)
Creatinine, Ser: 0.78 mg/dL (ref 0.57–1.00)
Globulin, Total: 2.9 g/dL (ref 1.5–4.5)
Glucose: 73 mg/dL (ref 70–99)
Potassium: 5.2 mmol/L (ref 3.5–5.2)
Sodium: 141 mmol/L (ref 134–144)
Total Protein: 7.3 g/dL (ref 6.0–8.5)
eGFR: 95 mL/min/{1.73_m2} (ref >59)

## 2023-09-09 LAB — CBC WITH DIFFERENTIAL/PLATELET
Basophils Absolute: 0.1 10*3/uL (ref 0.0–0.2)
Basos: 2 %
EOS (ABSOLUTE): 0.1 10*3/uL (ref 0.0–0.4)
Eos: 1 %
Hematocrit: 43.3 % (ref 34.0–46.6)
Hemoglobin: 14.6 g/dL (ref 11.1–15.9)
Immature Grans (Abs): 0 10*3/uL (ref 0.0–0.1)
Immature Granulocytes: 0 %
Lymphocytes Absolute: 1.4 10*3/uL (ref 0.7–3.1)
Lymphs: 39 %
MCH: 32.6 pg (ref 26.6–33.0)
MCHC: 33.7 g/dL (ref 31.5–35.7)
MCV: 97 fL (ref 79–97)
Monocytes Absolute: 0.2 10*3/uL (ref 0.1–0.9)
Monocytes: 7 %
Neutrophils Absolute: 1.8 10*3/uL (ref 1.4–7.0)
Neutrophils: 51 %
Platelets: 252 10*3/uL (ref 150–450)
RBC: 4.48 x10E6/uL (ref 3.77–5.28)
RDW: 12.1 % (ref 11.7–15.4)
WBC: 3.5 10*3/uL (ref 3.4–10.8)

## 2023-09-09 LAB — LIPID PANEL
Chol/HDL Ratio: 2.2 {ratio} (ref 0.0–4.4)
Cholesterol, Total: 189 mg/dL (ref 100–199)
HDL: 87 mg/dL (ref >39)
LDL Chol Calc (NIH): 91 mg/dL (ref 0–99)
Triglycerides: 57 mg/dL (ref 0–149)
VLDL Cholesterol Cal: 11 mg/dL (ref 5–40)

## 2023-09-09 LAB — TSH RFX ON ABNORMAL TO FREE T4: TSH: 0.768 u[IU]/mL (ref 0.450–4.500)

## 2023-10-26 ENCOUNTER — Ambulatory Visit: Payer: No Typology Code available for payment source | Admitting: Gastroenterology

## 2024-01-18 ENCOUNTER — Other Ambulatory Visit (HOSPITAL_BASED_OUTPATIENT_CLINIC_OR_DEPARTMENT_OTHER): Payer: Self-pay | Admitting: *Deleted

## 2024-01-18 ENCOUNTER — Encounter (HOSPITAL_BASED_OUTPATIENT_CLINIC_OR_DEPARTMENT_OTHER): Payer: Self-pay | Admitting: Family Medicine

## 2024-01-18 DIAGNOSIS — Z1211 Encounter for screening for malignant neoplasm of colon: Secondary | ICD-10-CM

## 2024-01-28 LAB — COLOGUARD: COLOGUARD: NEGATIVE

## 2024-02-09 ENCOUNTER — Encounter (HOSPITAL_BASED_OUTPATIENT_CLINIC_OR_DEPARTMENT_OTHER): Payer: Self-pay | Admitting: Family Medicine

## 2024-02-27 ENCOUNTER — Other Ambulatory Visit (HOSPITAL_BASED_OUTPATIENT_CLINIC_OR_DEPARTMENT_OTHER): Payer: Self-pay | Admitting: Family Medicine

## 2024-02-27 DIAGNOSIS — Z0000 Encounter for general adult medical examination without abnormal findings: Secondary | ICD-10-CM

## 2024-03-23 LAB — HM MAMMOGRAPHY

## 2024-04-04 ENCOUNTER — Encounter: Payer: Self-pay | Admitting: Family Medicine

## 2024-09-07 ENCOUNTER — Encounter (HOSPITAL_BASED_OUTPATIENT_CLINIC_OR_DEPARTMENT_OTHER): Payer: No Typology Code available for payment source | Admitting: Family Medicine

## 2024-09-07 ENCOUNTER — Encounter (HOSPITAL_BASED_OUTPATIENT_CLINIC_OR_DEPARTMENT_OTHER): Payer: Self-pay

## 2024-09-10 ENCOUNTER — Other Ambulatory Visit (HOSPITAL_BASED_OUTPATIENT_CLINIC_OR_DEPARTMENT_OTHER): Payer: Self-pay | Admitting: Family Medicine

## 2025-01-01 ENCOUNTER — Encounter (HOSPITAL_BASED_OUTPATIENT_CLINIC_OR_DEPARTMENT_OTHER): Payer: Self-pay | Admitting: Family Medicine

## 2025-01-01 NOTE — Telephone Encounter (Signed)
 Please see mychart message sent by pt and advise.

## 2025-01-02 ENCOUNTER — Ambulatory Visit (INDEPENDENT_AMBULATORY_CARE_PROVIDER_SITE_OTHER): Admitting: Family Medicine

## 2025-01-02 ENCOUNTER — Encounter (HOSPITAL_BASED_OUTPATIENT_CLINIC_OR_DEPARTMENT_OTHER): Payer: Self-pay | Admitting: Family Medicine

## 2025-01-02 VITALS — BP 128/83 | HR 80 | Temp 98.3°F | Resp 18 | Ht 62.0 in | Wt 160.0 lb

## 2025-01-02 DIAGNOSIS — Z0000 Encounter for general adult medical examination without abnormal findings: Secondary | ICD-10-CM | POA: Diagnosis not present

## 2025-01-02 NOTE — Progress Notes (Signed)
 " Subjective:    CC: Annual Physical Exam  HPI: Kaitlyn Guzman is a 49 y.o. presenting for annual physical  I reviewed the past medical history, family history, social history, surgical history, and allergies today and no changes were needed.  Please see the problem list section below in epic for further details.  Past Medical History: Past Medical History:  Diagnosis Date   Allergy    Sulfa meds   Anxiety    Depression    Past Surgical History: Past Surgical History:  Procedure Laterality Date   BREAST REDUCTION SURGERY Bilateral    BREAST SURGERY     Breast reduction   CHOLECYSTECTOMY     LIPOSUCTION     08/19/2021   TONSILLECTOMY     Social History: Social History   Socioeconomic History   Marital status: Single    Spouse name: Not on file   Number of children: Not on file   Years of education: Not on file   Highest education level: Bachelor's degree (e.g., BA, AB, BS)  Occupational History   Not on file  Tobacco Use   Smoking status: Never    Passive exposure: Past   Smokeless tobacco: Never  Vaping Use   Vaping status: Never Used  Substance and Sexual Activity   Alcohol use: Yes    Alcohol/week: 7.0 standard drinks of alcohol    Types: 7 Glasses of wine per week   Drug use: Never   Sexual activity: Yes    Birth control/protection: Abstinence  Other Topics Concern   Not on file  Social History Narrative   Not on file   Social Drivers of Health   Tobacco Use: Low Risk (01/02/2025)   Patient History    Smoking Tobacco Use: Never    Smokeless Tobacco Use: Never    Passive Exposure: Past  Financial Resource Strain: Low Risk (12/29/2024)   Overall Financial Resource Strain (CARDIA)    Difficulty of Paying Living Expenses: Not hard at all  Food Insecurity: No Food Insecurity (12/29/2024)   Epic    Worried About Programme Researcher, Broadcasting/film/video in the Last Year: Never true    Ran Out of Food in the Last Year: Never true  Transportation Needs: No Transportation  Needs (12/29/2024)   Epic    Lack of Transportation (Medical): No    Lack of Transportation (Non-Medical): No  Physical Activity: Inactive (12/29/2024)   Exercise Vital Sign    Days of Exercise per Week: 0 days    Minutes of Exercise per Session: Not on file  Stress: No Stress Concern Present (12/29/2024)   [employer] of Occupational Health - Occupational Stress Questionnaire    Feeling of Stress: Only a little  Social Connections: Socially Integrated (12/29/2024)   Social Connection and Isolation Panel    Frequency of Communication with Friends and Family: More than three times a week    Frequency of Social Gatherings with Friends and Family: Once a week    Attends Religious Services: 1 to 4 times per year    Active Member of Clubs or Organizations: Yes    Attends Banker Meetings: More than 4 times per year    Marital Status: Living with partner  Depression (PHQ2-9): Low Risk (01/02/2025)   Depression (PHQ2-9)    PHQ-2 Score: 3  Alcohol Screen: Low Risk (12/29/2024)   Alcohol Screen    Last Alcohol Screening Score (AUDIT): 3  Housing: Unknown (12/29/2024)   Epic    Unable to Pay for Housing  in the Last Year: No    Number of Times Moved in the Last Year: Not on file    Homeless in the Last Year: No  Utilities: Not At Risk (09/07/2023)   [facility] Utilities    Threatened with loss of utilities: No  Health Literacy: Adequate Health Literacy (09/07/2023)   B1300 Health Literacy    Frequency of need for help with medical instructions: Never   Family History: Family History  Problem Relation Age of Onset   Hypertension Mother    Diabetes Mother    Anxiety disorder Mother    Arthritis Mother    Depression Mother    Obesity Mother    Diabetes Father    Hypertension Father    Cancer Father    Diabetes Maternal Grandmother    Hypertension Maternal Grandmother    Heart disease Maternal Grandmother    Kidney disease Maternal Grandmother    Obesity Maternal Grandmother     Gout Maternal Grandfather    Diabetes Maternal Grandfather    Hypertension Maternal Grandfather    Allergies: Allergies[1] Medications: See med rec.  Review of Systems: No headache, visual changes, nausea, vomiting, diarrhea, constipation, dizziness, abdominal pain, skin rash, fevers, chills, night sweats, swollen lymph nodes, weight loss, chest pain, body aches, joint swelling, muscle aches, shortness of breath, mood changes, visual or auditory hallucinations.  Objective:    BP 128/83 (BP Location: Left Arm, Patient Position: Sitting, Cuff Size: Normal)   Pulse 80   Temp 98.3 F (36.8 C) (Oral)   Resp 18   Ht 5' 2 (1.575 m)   Wt 160 lb (72.6 kg)   LMP 01/02/2025 (Approximate)   SpO2 100%   BMI 29.26 kg/m   General: Well Developed, well nourished, and in no acute distress. Neuro: Alert and oriented x3, extra-ocular muscles intact, sensation grossly intact. Cranial nerves II through XII are intact, motor, sensory, and coordinative functions are all intact. HEENT: Normocephalic, atraumatic, pupils equal round reactive to light, neck supple, no masses, no lymphadenopathy, thyroid  nonpalpable. Oropharynx, nasopharynx, external ear canals are unremarkable. Skin: Warm and dry, no rashes noted. Cardiac: Regular rate and rhythm, no murmurs rubs or gallops. Respiratory: Clear to auscultation bilaterally. Not using accessory muscles, speaking in full sentences. Abdominal: Soft, mild TTP in epigastric region, nondistended, positive bowel sounds, no masses, no organomegaly. Musculoskeletal: Shoulder, elbow, wrist, hip, knee, ankle stable, and with full range of motion.  Impression and Recommendations:    Wellness examination Assessment & Plan: Routine HCM labs reviewed. HCM reviewed/discussed. Anticipatory guidance regarding healthy weight, lifestyle and choices given. Recommend healthy diet.  Recommend approximately 150 minutes/week of moderate intensity exercise Recommend regular  dental and vision exams Always use seatbelt/lap and shoulder restraints Recommend using smoke alarms and checking batteries at least twice a year Recommend using sunscreen when outside Discussed colon cancer screening recommendations, options.  UTD with Cologuard, next due 2028 Discussed immunization recommendations   Return in about 1 year (around 01/02/2026) for CPE.   ___________________________________________ Micco [place], MD, ABFM, CAQSM [facility]    [1]  Allergies Allergen Reactions   Sulfa Antibiotics Shortness Of Breath and Anaphylaxis   Sulfadiazine Rash   "

## 2025-01-02 NOTE — Assessment & Plan Note (Signed)
 Routine HCM labs reviewed. HCM reviewed/discussed. Anticipatory guidance regarding healthy weight, lifestyle and choices given. Recommend healthy diet.  Recommend approximately 150 minutes/week of moderate intensity exercise Recommend regular dental and vision exams Always use seatbelt/lap and shoulder restraints Recommend using smoke alarms and checking batteries at least twice a year Recommend using sunscreen when outside Discussed colon cancer screening recommendations, options.  UTD with Cologuard, next due 2028 Discussed immunization recommendations

## 2026-01-03 ENCOUNTER — Encounter (HOSPITAL_BASED_OUTPATIENT_CLINIC_OR_DEPARTMENT_OTHER): Admitting: Family Medicine
